# Patient Record
Sex: Female | Born: 1935 | Race: White | Hispanic: No | Smoking: Never smoker
Health system: Southern US, Community
[De-identification: ages and names within clinical notes are randomized; demographics above are authoritative.]

## PROBLEM LIST (undated history)

## (undated) DIAGNOSIS — H00022 Hordeolum internum right lower eyelid: Secondary | ICD-10-CM

## (undated) DIAGNOSIS — R739 Hyperglycemia, unspecified: Secondary | ICD-10-CM

## (undated) DIAGNOSIS — G309 Alzheimer's disease, unspecified: Secondary | ICD-10-CM

## (undated) DIAGNOSIS — N3281 Overactive bladder: Secondary | ICD-10-CM

## (undated) DIAGNOSIS — F419 Anxiety disorder, unspecified: Secondary | ICD-10-CM

## (undated) DIAGNOSIS — R609 Edema, unspecified: Secondary | ICD-10-CM

## (undated) DIAGNOSIS — M1990 Unspecified osteoarthritis, unspecified site: Secondary | ICD-10-CM

## (undated) DIAGNOSIS — I10 Essential (primary) hypertension: Secondary | ICD-10-CM

## (undated) DIAGNOSIS — E559 Vitamin D deficiency, unspecified: Secondary | ICD-10-CM

## (undated) DIAGNOSIS — K5900 Constipation, unspecified: Secondary | ICD-10-CM

## (undated) DIAGNOSIS — M810 Age-related osteoporosis without current pathological fracture: Secondary | ICD-10-CM

## (undated) DIAGNOSIS — Z66 Do not resuscitate: Secondary | ICD-10-CM

## (undated) DIAGNOSIS — R1312 Dysphagia, oropharyngeal phase: Secondary | ICD-10-CM

## (undated) DIAGNOSIS — L03119 Cellulitis of unspecified part of limb: Secondary | ICD-10-CM

## (undated) DIAGNOSIS — K5790 Diverticulosis of intestine, part unspecified, without perforation or abscess without bleeding: Secondary | ICD-10-CM

## (undated) DIAGNOSIS — R1310 Dysphagia, unspecified: Secondary | ICD-10-CM

## (undated) DIAGNOSIS — F329 Major depressive disorder, single episode, unspecified: Secondary | ICD-10-CM

## (undated) DIAGNOSIS — F0280 Dementia in other diseases classified elsewhere without behavioral disturbance: Secondary | ICD-10-CM

## (undated) DIAGNOSIS — F0390 Unspecified dementia without behavioral disturbance: Secondary | ICD-10-CM

## (undated) HISTORY — DX: Diverticulosis of intestine, part unspecified, without perforation or abscess without bleeding: K57.90

## (undated) HISTORY — DX: Unspecified osteoarthritis, unspecified site: M19.90

## (undated) HISTORY — DX: Alzheimer's disease, unspecified: G30.9

## (undated) HISTORY — PX: FOOT SURGERY: SHX648

## (undated) HISTORY — DX: Major depressive disorder, single episode, unspecified: F32.9

## (undated) HISTORY — DX: Dysphagia, oropharyngeal phase: R13.12

## (undated) HISTORY — DX: Edema, unspecified: R60.9

## (undated) HISTORY — DX: Cellulitis of unspecified part of limb: L03.119

## (undated) HISTORY — PX: OTHER SURGICAL HISTORY: SHX169

## (undated) HISTORY — DX: Hordeolum internum right lower eyelid: H00.022

## (undated) HISTORY — DX: Anxiety disorder, unspecified: F41.9

## (undated) HISTORY — DX: Dementia in other diseases classified elsewhere without behavioral disturbance: F02.80

## (undated) HISTORY — DX: Dysphagia, unspecified: R13.10

## (undated) HISTORY — DX: Constipation, unspecified: K59.00

## (undated) HISTORY — PX: FRACTURE SURGERY: SHX138

## (undated) HISTORY — PX: JOINT REPLACEMENT: SHX530

## (undated) HISTORY — PX: ADENOIDECTOMY: SUR15

## (undated) HISTORY — DX: Vitamin D deficiency, unspecified: E55.9

## (undated) HISTORY — DX: Hyperglycemia, unspecified: R73.9

## (undated) HISTORY — PX: TONSILLECTOMY: SUR1361

## (undated) HISTORY — DX: Age-related osteoporosis without current pathological fracture: M81.0

## (undated) HISTORY — PX: KNEE SURGERY: SHX244

---

## 2011-01-03 ENCOUNTER — Emergency Department (INDEPENDENT_AMBULATORY_CARE_PROVIDER_SITE_OTHER): Payer: Medicare Other

## 2011-01-03 ENCOUNTER — Emergency Department (HOSPITAL_BASED_OUTPATIENT_CLINIC_OR_DEPARTMENT_OTHER)
Admission: EM | Admit: 2011-01-03 | Discharge: 2011-01-03 | Disposition: A | Discharge status: Home / Self Care | Payer: Medicare Other | Attending: Emergency Medicine | Admitting: Emergency Medicine

## 2011-01-03 DIAGNOSIS — F0390 Unspecified dementia without behavioral disturbance: Secondary | ICD-10-CM | POA: Insufficient documentation

## 2011-01-03 DIAGNOSIS — Y9210 Unspecified residential institution as the place of occurrence of the external cause: Secondary | ICD-10-CM | POA: Insufficient documentation

## 2011-01-03 DIAGNOSIS — R079 Chest pain, unspecified: Secondary | ICD-10-CM

## 2011-01-03 DIAGNOSIS — S5000XA Contusion of unspecified elbow, initial encounter: Secondary | ICD-10-CM | POA: Insufficient documentation

## 2011-01-03 DIAGNOSIS — I10 Essential (primary) hypertension: Secondary | ICD-10-CM | POA: Insufficient documentation

## 2011-01-03 DIAGNOSIS — W1811XA Fall from or off toilet without subsequent striking against object, initial encounter: Secondary | ICD-10-CM

## 2011-01-03 DIAGNOSIS — Z79899 Other long term (current) drug therapy: Secondary | ICD-10-CM | POA: Insufficient documentation

## 2011-08-01 ENCOUNTER — Emergency Department (HOSPITAL_BASED_OUTPATIENT_CLINIC_OR_DEPARTMENT_OTHER)
Admission: EM | Admit: 2011-08-01 | Discharge: 2011-08-01 | Disposition: A | Discharge status: Skilled Nursing Facility | Payer: PRIVATE HEALTH INSURANCE | Attending: Emergency Medicine | Admitting: Emergency Medicine

## 2011-08-01 ENCOUNTER — Emergency Department (INDEPENDENT_AMBULATORY_CARE_PROVIDER_SITE_OTHER): Payer: PRIVATE HEALTH INSURANCE

## 2011-08-01 DIAGNOSIS — S7000XA Contusion of unspecified hip, initial encounter: Secondary | ICD-10-CM | POA: Insufficient documentation

## 2011-08-01 DIAGNOSIS — Y9210 Unspecified residential institution as the place of occurrence of the external cause: Secondary | ICD-10-CM | POA: Insufficient documentation

## 2011-08-01 DIAGNOSIS — W19XXXA Unspecified fall, initial encounter: Secondary | ICD-10-CM | POA: Insufficient documentation

## 2011-08-01 DIAGNOSIS — I10 Essential (primary) hypertension: Secondary | ICD-10-CM | POA: Insufficient documentation

## 2011-08-01 DIAGNOSIS — M79609 Pain in unspecified limb: Secondary | ICD-10-CM

## 2011-08-01 DIAGNOSIS — F0390 Unspecified dementia without behavioral disturbance: Secondary | ICD-10-CM | POA: Insufficient documentation

## 2011-08-01 DIAGNOSIS — Z79899 Other long term (current) drug therapy: Secondary | ICD-10-CM | POA: Insufficient documentation

## 2011-08-01 DIAGNOSIS — T148XXA Other injury of unspecified body region, initial encounter: Secondary | ICD-10-CM

## 2011-08-01 DIAGNOSIS — M25559 Pain in unspecified hip: Secondary | ICD-10-CM

## 2011-08-01 HISTORY — DX: Unspecified dementia, unspecified severity, without behavioral disturbance, psychotic disturbance, mood disturbance, and anxiety: F03.90

## 2011-08-01 HISTORY — DX: Overactive bladder: N32.81

## 2011-08-01 HISTORY — DX: Essential (primary) hypertension: I10

## 2011-08-01 NOTE — ED Notes (Signed)
Pt ambulatory to radiology

## 2011-08-01 NOTE — ED Provider Notes (Addendum)
History     CSN: [id number] Arrival date & time: 08/01/2011  7:24 PM   First MD Initiated Contact with Patient 08/01/11 1933      Chief Complaint  Patient presents with  . Hip Pain  . Fall    (Consider location/radiation/quality/duration/timing/severity/associated sxs/prior treatment) Patient is a 77 y.o. female presenting with hip pain and fall. The history is provided by a caregiver and a relative.  Hip Pain  Fall  level 5 caveat due to dementia  Patient at [facility] which is assisted living.  Caretaker came in at noon and noted patient had bruise and swelling left hip and bruise left upper arm.  Asked staff what happened and they reported she had pulled call bell and found on floor.  PMD Dr. Ward Chatters with [facility].   Past Medical History  Diagnosis Date  . Dementia   . Hypertension   . Overactive bladder     Past Surgical History  Procedure Date  . Fracture surgery   . Joint replacement     No family history on file.  History  Substance Use Topics  . Smoking status: Never Smoker   . Smokeless tobacco: Never Used  . Alcohol Use: No    OB History    Grav Para Term Preterm Abortions TAB SAB Ect Mult Living                  Review of Systems  Unable to perform ROS   Allergies  Review of patient's allergies indicates no known allergies.  Home Medications   Current Outpatient Rx  Name Route Sig Dispense Refill  . CALCIUM CARBONATE-VITAMIN D 500-200 MG-UNIT PO TABS Oral Take 1 tablet by mouth daily.      Marland Kitchen DOCUSATE SODIUM 100 MG PO CAPS Oral Take 100 mg by mouth 3 (three) times daily.      . DONEPEZIL HCL 23 MG PO TABS Oral Take 23 mg by mouth daily.      Marland Kitchen ESCITALOPRAM OXALATE 10 MG PO TABS Oral Take 10 mg by mouth daily.      Marland Kitchen LORATADINE 10 MG PO TABS Oral Take 10 mg by mouth daily.      Marland Kitchen LOSARTAN POTASSIUM 50 MG PO TABS Oral Take 50 mg by mouth daily.      . ABC PLUS SENIOR PO TABS Oral Take 1 tablet by mouth daily.      . OXYBUTYNIN  CHLORIDE ER 10 MG PO TB24 Oral Take 10 mg by mouth daily.      . ACETAMINOPHEN 325 MG PO TABS Oral Take 650 mg by mouth every 4 (four) hours as needed. For pain     . MEMANTINE HCL 5 (28)-10 (21) MG PO TABS Oral Take by mouth See admin instructions. 5 mg/day for =1 week; 5 mg twice daily for =1 week; 15 mg/day given in 5 mg and 10 mg separated doses for =1 week; then 10 mg twice daily       BP 143/74  Pulse 70  Resp 16  Ht 5\' 3"  (1.6 m)  Wt 135 lb (61.236 kg)  BMI 23.91 kg/m2  SpO2 97%  Physical Exam  Nursing note and vitals reviewed. Constitutional: She appears well-developed and well-nourished.  HENT:  Head: Normocephalic and atraumatic.  Eyes: Conjunctivae and EOM are normal. Pupils are equal, round, and reactive to light.  Neck: Normal range of motion. Neck supple.  Cardiovascular: Normal rate and regular rhythm.   Pulmonary/Chest: Effort normal and breath sounds normal.  Abdominal: Soft. Bowel sounds are normal.  Musculoskeletal:       Contusion left upper mid arm, contusion left lateral upper thigh    ED Course  Procedures (including critical care time)  Labs Reviewed - No data to display Dg Hip Complete Left  08/01/2011  *RADIOLOGY REPORT*  Clinical Data: 77 year old female with left hip pain following fall.  LEFT HIP - COMPLETE 2+ VIEW  Comparison: None  Findings: There is no evidence of acute fracture, subluxation or dislocation. Mild degenerative changes in both hips are noted. Moderate degenerative changes are present in the lower lumbar spine. Probable osteopenia is noted. No focal bony lesions are identified.  IMPRESSION: No evidence of acute bony abnormality.  Original Report Authenticated By: Rosendo Gros, M.D.   Dg Humerus Left  08/01/2011  *RADIOLOGY REPORT*  Clinical Data: 77 year old female with left arm pain following fall.  LEFT HUMERUS - 2+ VIEW  Comparison: 01/03/2011 chest radiograph  Findings: There is no evidence of acute fracture, subluxation or  dislocation. Severe degenerative changes of the left shoulder are present. A remote fracture of the distal humerus is noted. No suspicious focal bony lesions are noted.  IMPRESSION: No evidence of acute bony abnormality.  Severe degenerative changes of the left shoulder and remote distal humeral fracture.  Original Report Authenticated By: Rosendo Gros, M.D.     No diagnosis found.    MDM          Hilario Quarry, MD 08/01/11 0454  Hilario Quarry, MD 08/01/11 2001

## 2011-08-01 NOTE — ED Notes (Signed)
Family reports pt fell this am.  Pt has bruising and a "knot" on the left thigh.  Pt denies pain and ambulated to triage.

## 2011-08-01 NOTE — ED Notes (Signed)
Pt. Walked to room #7 from the triage / wt room area.  Steady gait noted by pt.

## 2011-12-06 ENCOUNTER — Emergency Department (HOSPITAL_BASED_OUTPATIENT_CLINIC_OR_DEPARTMENT_OTHER)
Admission: EM | Admit: 2011-12-06 | Discharge: 2011-12-07 | Disposition: A | Discharge status: Home / Self Care | Payer: PRIVATE HEALTH INSURANCE | Attending: Emergency Medicine | Admitting: Emergency Medicine

## 2011-12-06 ENCOUNTER — Emergency Department (INDEPENDENT_AMBULATORY_CARE_PROVIDER_SITE_OTHER): Payer: PRIVATE HEALTH INSURANCE

## 2011-12-06 ENCOUNTER — Encounter (HOSPITAL_BASED_OUTPATIENT_CLINIC_OR_DEPARTMENT_OTHER): Payer: Self-pay | Admitting: Student

## 2011-12-06 DIAGNOSIS — Y9210 Unspecified residential institution as the place of occurrence of the external cause: Secondary | ICD-10-CM | POA: Insufficient documentation

## 2011-12-06 DIAGNOSIS — S4980XA Other specified injuries of shoulder and upper arm, unspecified arm, initial encounter: Secondary | ICD-10-CM | POA: Insufficient documentation

## 2011-12-06 DIAGNOSIS — I10 Essential (primary) hypertension: Secondary | ICD-10-CM | POA: Insufficient documentation

## 2011-12-06 DIAGNOSIS — X58XXXA Exposure to other specified factors, initial encounter: Secondary | ICD-10-CM

## 2011-12-06 DIAGNOSIS — F0390 Unspecified dementia without behavioral disturbance: Secondary | ICD-10-CM | POA: Insufficient documentation

## 2011-12-06 DIAGNOSIS — S46909A Unspecified injury of unspecified muscle, fascia and tendon at shoulder and upper arm level, unspecified arm, initial encounter: Secondary | ICD-10-CM | POA: Insufficient documentation

## 2011-12-06 DIAGNOSIS — S4990XA Unspecified injury of shoulder and upper arm, unspecified arm, initial encounter: Secondary | ICD-10-CM

## 2011-12-06 DIAGNOSIS — W1809XA Striking against other object with subsequent fall, initial encounter: Secondary | ICD-10-CM | POA: Insufficient documentation

## 2011-12-06 DIAGNOSIS — M25519 Pain in unspecified shoulder: Secondary | ICD-10-CM

## 2011-12-06 MED ORDER — ACETAMINOPHEN 325 MG PO TABS
650.0000 mg | ORAL_TABLET | Freq: Once | ORAL | Status: AC
  Administered 2011-12-06: 650 mg via ORAL
  Filled 2011-12-06: qty 2

## 2011-12-06 NOTE — ED Provider Notes (Addendum)
History     CSN: [id number]  Arrival date & time 12/06/11  2123   First MD Initiated Contact with Patient 12/06/11 2250      Chief Complaint  Patient presents with  . Shoulder Injury    (Consider location/radiation/quality/duration/timing/severity/associated sxs/prior treatment) Patient is a 78 y.o. female presenting with shoulder injury. The history is provided by the nursing home and a caregiver.  Shoulder Injury This is a new problem. Episode onset: Unknown. The problem occurs constantly. The problem has not changed since onset.Pertinent negatives include no headaches. Exacerbated by: Palpation of the a.c. joint. The symptoms are relieved by nothing. She has tried nothing for the symptoms.   brought in by caregiver and son. Patient lives in assisted living and does have dementia and is not a reliable historian. She was at physical therapy today and her therapist noticed that she had a deformity in her left shoulder. She reportedly fell about 3 weeks ago injuring her left elbow and striking her head. She was evaluated at that time he did have x-rays are reported as normal. Since that time she is doing her normal day-to-day activities including using a walker and with assistance bathing and dressing herself. Patient denies any specific complaints. No known recent injuries otherwise. Patient was not complaining of any significant discomfort. Physical therapy and the therapist simply noticed it was described as her clavicle bone sticking up. Level V caveat applies.  Past Medical History  Diagnosis Date  . Dementia   . Hypertension   . Overactive bladder     Past Surgical History  Procedure Date  . Fracture surgery   . Joint replacement     History reviewed. No pertinent family history.  History  Substance Use Topics  . Smoking status: Never Smoker   . Smokeless tobacco: Never Used  . Alcohol Use: No    OB History    Grav Para Term Preterm Abortions TAB SAB Ect Mult Living                  Review of Systems  Neurological: Negative for headaches.   level V caveat applies otherwise  Allergies  Review of patient's allergies indicates no known allergies.  Home Medications   Current Outpatient Rx  Name Route Sig Dispense Refill  . CALCIUM CARBONATE-VITAMIN D 500-200 MG-UNIT PO TABS Oral Take 1 tablet by mouth daily.      Marland Kitchen CIPROFLOXACIN HCL 500 MG PO TABS Oral Take 500 mg by mouth 2 (two) times daily.    . DONEPEZIL HCL 10 MG PO TABS Oral Take 10 mg by mouth at bedtime as needed.    Marland Kitchen ESCITALOPRAM OXALATE 10 MG PO TABS Oral Take 10 mg by mouth daily.      Marland Kitchen LORAZEPAM 0.5 MG PO TABS Oral Take 0.5 mg by mouth every 8 (eight) hours.    Marland Kitchen LOSARTAN POTASSIUM 50 MG PO TABS Oral Take 50 mg by mouth daily.      Marland Kitchen MEMANTINE HCL 5 (28)-10 (21) MG PO TABS Oral Take by mouth See admin instructions. 5 mg/day for =1 week; 5 mg twice daily for =1 week; 15 mg/day given in 5 mg and 10 mg separated doses for =1 week; then 10 mg twice daily     . ABC PLUS SENIOR PO TABS Oral Take 1 tablet by mouth daily.      Marland Kitchen POLYETHYLENE GLYCOL 3350 PO PACK Oral Take 17 g by mouth daily.    . ACETAMINOPHEN 325 MG PO TABS  Oral Take 650 mg by mouth every 4 (four) hours as needed. For pain       BP 121/76  Pulse 76  Temp(Src) 98.1 F (36.7 C) (Oral)  Resp 20  Wt 130 lb (58.968 kg)  SpO2 96%  Physical Exam  Constitutional: She appears well-developed and well-nourished.  HENT:  Head: Normocephalic and atraumatic.       No clinical evidence of acute trauma  Eyes: Conjunctivae and EOM are normal. Pupils are equal, round, and reactive to light.  Neck: Trachea normal. Neck supple. No thyromegaly present.       No midline cervical tenderness or deformity.  Cardiovascular: Normal rate, regular rhythm, S1 normal, S2 normal and normal pulses.     No systolic murmur is present   No diastolic murmur is present  Pulses:      Radial pulses are 2+ on the right side, and 2+ on the left side.    Pulmonary/Chest: Effort normal and breath sounds normal. She has no wheezes. She has no rhonchi. She has no rales. She exhibits no tenderness.  Abdominal: Soft. Normal appearance and bowel sounds are normal. There is no tenderness. There is no CVA tenderness and negative Murphy's sign.  Musculoskeletal:       Left upper extremity: A.c. deformity at left shoulder consistent with clinical a.c. separation. Mild tenderness to palpation without any erythema or swelling. Good range of motion at shoulder, elbow and wrist. Distal neurovascular is intact without tenderness otherwise  Neurological: She is alert. She has normal strength. No cranial nerve deficit or sensory deficit. GCS eye subscore is 4. GCS verbal subscore is 5. GCS motor subscore is 6.       Alert and oriented to self only  Skin: Skin is warm and dry. No rash noted. She is not diaphoretic.  Psychiatric: Her speech is normal.       Cooperative and appropriate    ED Course  Procedures (including critical care time)  Labs Reviewed - No data to display Dg Shoulder Left  12/06/2011  *RADIOLOGY REPORT*  Clinical Data: Left shoulder injury, with left shoulder pain.  LEFT SHOULDER - 2+ VIEW  Comparison: Left humeral radiographs performed 08/01/2011  Findings: There is no evidence of fracture or dislocation.  There is diffuse degenerative change at the left humeral head, with extensive sclerotic change and joint space narrowing.  Large associated osteophytes are seen.  Lucency within the left humeral head appears to reflect the overlying glenoid.  The acromioclavicular joint is unremarkable in appearance.  No significant soft tissue abnormalities are seen.  Chronic peribronchial thickening and increased interstitial markings are noted at the visualized portions of the lungs.  IMPRESSION:  1.  No evidence of fracture or dislocation. 2.  Significant degenerative change at the left humeral head, with joint space loss and large associated osteophytes.  3.  Chronic changes at the visualized portions of the lungs.  Original Report Authenticated By: Tonia Ghent, M.D.    Gustavus Bryant. Tylenol. Sling provided.   MDM   Clinical left a.c. Separation ? Acuity due to dementia. I suspect this injury occurred 3 weeks ago when patient last fell. Plan sling, NSAIDs and orthopedic followup. Referral provided with acromioclavicular separation precautions        Sunnie Nielsen, MD 12/06/11 9563  Sunnie Nielsen, MD 12/06/11 [phone]

## 2011-12-06 NOTE — Discharge Instructions (Signed)
Acromioclavicular Injuries  The AC (acromioclavicular) joint is the joint in the shoulder where the collarbone (clavicle) meets the shoulder blade (scapula). The part of the shoulder blade connected to the collarbone is called the acromion. Common problems with and treatments for the [facility] joint are detailed below.  ARTHRITIS  Arthritis occurs when the joint has been injured and the smooth padding between the joints (cartilage) is lost. This is the wear and tear seen in most joints of the body if they have been overused. This causes the joint to produce pain and swelling which is worse with activity.  AC JOINT SEPARATION  AC joint separation means that the ligaments connecting the acromion of the shoulder blade and collarbone have been damaged, and the two bones no longer line up. AC separations can be anywhere from mild to severe, and are "graded" depending upon which ligaments are torn and how badly they are torn.  Grade I Injury: the least damage is done, and the [facility] joint still lines up.  Grade II Injury: damage to the ligaments which reinforce the [facility] joint. In a Grade II injury, these ligaments are stretched but not entirely torn. When stressed, the [facility] joint becomes painful and unstable.  Grade III Injury: AC and secondary ligaments are completely torn, and the collarbone is no longer attached to the shoulder blade. This results in deformity; a prominence of the end of the clavicle.  AC JOINT FRACTURE  AC joint fracture means that there has been a break in the bones of the [facility] joint, usually the end of the clavicle.  TREATMENT  TREATMENT OF AC ARTHRITIS  There is currently no way to replace the cartilage damaged by arthritis. The best way to improve the condition is to decrease the activities which aggravate the problem. Application of ice to the joint helps decrease pain and soreness (inflammation). The use of non-steroidal anti-inflammatory medication is helpful.  If less conservative measures do not  work, then cortisone shots (injections) may be used. These are anti-inflammatories; they decrease the soreness in the joint and swelling.  If non-surgical measures fail, surgery may be recommended. The procedure is generally removal of a portion of the end of the clavicle. This is the part of the collarbone closest to your acromion which is stabilized with ligaments to the acromion of the shoulder blade. This surgery may be performed using a tube-like instrument with a light (arthroscope) for looking into a joint. It may also be performed as an open surgery through a small incision by the surgeon. Most patients will have good range of motion within 6 weeks and may return to all activity including sports by 8-12 weeks, barring complications.  TREATMENT OF AN AC SEPARATION  The initial treatment is to decrease pain. This is best accomplished by immobilizing the arm in a sling and placing an ice pack to the shoulder for 20 to 30 minutes every 2 hours as needed. As the pain starts to subside, it is important to begin moving the fingers, wrist, elbow and eventually the shoulder in order to prevent a stiff or "frozen" shoulder. Instruction on when and how much to move the shoulder will be provided by your caregiver. The length of time needed to regain full motion and function depends on the amount or grade of the injury. Recovery from a Grade I AC separation usually takes 10 to 14 days, whereas a Grade III may take 6 to 8 weeks.  Grade I and II separations usually do not require surgery.  Even Grade III injuries usually allow return to full activity with few restrictions. Treatment is also based on the activity demands of the injured shoulder. For example, a high level quarterback with an injured throwing arm will receive more aggressive treatment than someone with a desk job who rarely uses his/her arm for strenuous activities. In some cases, a painful lump may persist which could require a later surgery. Surgery can  be very successful, but the benefits must be weighed against the potential risks.  TREATMENT OF AN AC JOINT FRACTURE  Fracture treatment depends on the type of fracture. Sometimes a splint or sling may be all that is required. Other times surgery may be required for repair. This is more frequently the case when the ligaments supporting the clavicle are completely torn. Your caregiver will help you with these decisions and together you can decide what will be the best treatment.  HOME CARE INSTRUCTIONS  Apply ice to the injury for 15 to 20 minutes each hour while awake for 2 days. Put the ice in a plastic bag and place a towel between the bag of ice and skin.  If a sling has been applied, wear it constantly for as long as directed by your caregiver, even at night. The sling or splint can be removed for bathing or showering or as directed. Be sure to keep the shoulder in the same place as when the sling is on. Do not lift the arm.  If a figure-of-eight splint has been applied it should be tightened gently by another person every day. Tighten it enough to keep the shoulders held back. Allow enough room to place the index finger between the body and strap. Loosen the splint immediately if there is numbness or tingling in the hands.  Take over-the-counter or prescription medicines for pain, discomfort or fever as directed by your caregiver.  If you or your child has received a follow up appointment, it is very important to keep that appointment in order to avoid long term complications, chronic pain or disability.  SEEK MEDICAL CARE IF:  The pain is not relieved with medications.  There is increased swelling or discoloration that continues to get worse rather than better.  You or your child has been unable to follow up as instructed.  There is progressive numbness and tingling in the arm, forearm or hand.  SEEK IMMEDIATE MEDICAL CARE IF:  The arm is numb, cold or pale.  There is increasing pain in the  hand, forearm or fingers.  MAKE SURE YOU:  Understand these instructions.  Will watch your condition.  Will get help right away if you are not doing well or get worse.

## 2011-12-06 NOTE — ED Notes (Signed)
Family refused offer of ice/sling application. States pt will be noncompliant and it will agitate her

## 2011-12-06 NOTE — ED Notes (Signed)
Family members noticed possible deformity to left shoulder today after pt was c/o of her bra strap hurting her shoulder. Pt has hx of left arm injuries, but the family states that her shoulder appearance is abnormal. Pt has limited movement. +PMS. No known injury.

## 2011-12-06 NOTE — ED Notes (Signed)
Pt in with c/o possible dislocated left shoulder, deformity noticed in triage.

## 2011-12-06 NOTE — ED Notes (Signed)
Warm blanket offered. Pt and family given drinks.

## 2012-04-10 ENCOUNTER — Emergency Department (HOSPITAL_BASED_OUTPATIENT_CLINIC_OR_DEPARTMENT_OTHER): Payer: PRIVATE HEALTH INSURANCE

## 2012-04-10 ENCOUNTER — Emergency Department (HOSPITAL_BASED_OUTPATIENT_CLINIC_OR_DEPARTMENT_OTHER)
Admission: EM | Admit: 2012-04-10 | Discharge: 2012-04-10 | Disposition: A | Discharge status: Home Health | Payer: PRIVATE HEALTH INSURANCE | Attending: Emergency Medicine | Admitting: Emergency Medicine

## 2012-04-10 ENCOUNTER — Encounter (HOSPITAL_BASED_OUTPATIENT_CLINIC_OR_DEPARTMENT_OTHER): Payer: Self-pay | Admitting: *Deleted

## 2012-04-10 DIAGNOSIS — IMO0002 Reserved for concepts with insufficient information to code with codable children: Secondary | ICD-10-CM

## 2012-04-10 DIAGNOSIS — M25522 Pain in left elbow: Secondary | ICD-10-CM

## 2012-04-10 DIAGNOSIS — N318 Other neuromuscular dysfunction of bladder: Secondary | ICD-10-CM | POA: Insufficient documentation

## 2012-04-10 DIAGNOSIS — F0390 Unspecified dementia without behavioral disturbance: Secondary | ICD-10-CM | POA: Insufficient documentation

## 2012-04-10 DIAGNOSIS — Z96698 Presence of other orthopedic joint implants: Secondary | ICD-10-CM | POA: Insufficient documentation

## 2012-04-10 DIAGNOSIS — I10 Essential (primary) hypertension: Secondary | ICD-10-CM | POA: Insufficient documentation

## 2012-04-10 DIAGNOSIS — W19XXXA Unspecified fall, initial encounter: Secondary | ICD-10-CM | POA: Insufficient documentation

## 2012-04-10 DIAGNOSIS — S51009A Unspecified open wound of unspecified elbow, initial encounter: Secondary | ICD-10-CM | POA: Insufficient documentation

## 2012-04-10 MED ORDER — TETANUS-DIPHTH-ACELL PERTUSSIS 5-2.5-18.5 LF-MCG/0.5 IM SUSP
INTRAMUSCULAR | Status: AC
  Filled 2012-04-10: qty 0.5

## 2012-04-10 MED ORDER — TETANUS-DIPHTH-ACELL PERTUSSIS 5-2.5-18.5 LF-MCG/0.5 IM SUSP
0.5000 mL | Freq: Once | INTRAMUSCULAR | Status: AC
  Administered 2012-04-10: 0.5 mL via INTRAMUSCULAR

## 2012-04-10 MED ORDER — TRAMADOL HCL 50 MG PO TABS: 50.0000 mg | ORAL_TABLET | Freq: Four times a day (QID) | ORAL | Status: AC | PRN

## 2012-04-10 NOTE — ED Notes (Signed)
Pt was an unwitnessed fall presents with left elbow and shoulder pain

## 2012-04-10 NOTE — ED Provider Notes (Signed)
History     CSN: [id number]  Arrival date & time      First MD Initiated Contact with Patient 04/10/12 0521      Chief Complaint  Patient presents with  . Fall    (Consider location/radiation/quality/duration/timing/severity/associated sxs/prior treatment) [facility] pt with unwitnessed fall roughly 30 min prior to presentation. She sustained L elbow injury. Pt states she does not remember falling and does not think she did. No head or neck injury. Pt denies CP, SOB, cough, abd pain, fever, chills, N/V/D. Unknown last Td Past Medical History  Diagnosis Date  . Dementia   . Hypertension   . Overactive bladder     Past Surgical History  Procedure Date  . Fracture surgery   . Joint replacement     History reviewed. No pertinent family history.  History  Substance Use Topics  . Smoking status: Never Smoker   . Smokeless tobacco: Never Used  . Alcohol Use: No    OB History    Grav Para Term Preterm Abortions TAB SAB Ect Mult Living                  Review of Systems  Allergies  Review of patient's allergies indicates no known allergies.  Home Medications   Current Outpatient Rx  Name Route Sig Dispense Refill  . ACETAMINOPHEN 325 MG PO TABS Oral Take 650 mg by mouth every 4 (four) hours as needed. For pain     . CALCIUM CARBONATE-VITAMIN D 500-200 MG-UNIT PO TABS Oral Take 1 tablet by mouth daily.      Marland Kitchen CIPROFLOXACIN HCL 500 MG PO TABS Oral Take 500 mg by mouth 2 (two) times daily.    . DONEPEZIL HCL 10 MG PO TABS Oral Take 10 mg by mouth at bedtime as needed.    Marland Kitchen ESCITALOPRAM OXALATE 10 MG PO TABS Oral Take 10 mg by mouth daily.      Marland Kitchen LORAZEPAM 0.5 MG PO TABS Oral Take 0.5 mg by mouth every 8 (eight) hours.    Marland Kitchen LOSARTAN POTASSIUM 50 MG PO TABS Oral Take 50 mg by mouth daily.      Marland Kitchen MEMANTINE HCL 5 (28)-10 (21) MG PO TABS Oral Take by mouth See admin instructions. 5 mg/day for =1 week; 5 mg twice daily for =1 week; 15 mg/day given in 5 mg and 10 mg separated  doses for =1 week; then 10 mg twice daily     . ABC PLUS SENIOR PO TABS Oral Take 1 tablet by mouth daily.      Marland Kitchen POLYETHYLENE GLYCOL 3350 PO PACK Oral Take 17 g by mouth daily.    . TRAMADOL HCL 50 MG PO TABS Oral Take 1 tablet (50 mg total) by mouth every 6 (six) hours as needed for pain. 15 tablet 0    BP 179/70  Pulse 67  Temp 97.6 F (36.4 C) (Oral)  Resp 16  SpO2 99%  Physical Exam  Nursing note and vitals reviewed. Constitutional: She is oriented to person, place, and time. She appears well-developed and well-nourished. No distress.  HENT:  Head: Normocephalic and atraumatic.  Mouth/Throat: Oropharynx is clear and moist.  Eyes: EOM are normal. Pupils are equal, round, and reactive to light.  Neck: Normal range of motion. Neck supple.       No posterior cervical midline TTP  Cardiovascular: Normal rate and regular rhythm.   Pulmonary/Chest: Effort normal and breath sounds normal. No respiratory distress. She has no wheezes. She has no  rales.  Abdominal: Soft. Bowel sounds are normal. There is no tenderness. There is no rebound and no guarding.  Musculoskeletal: She exhibits no edema and no tenderness.       Small skin tear to L elbow with scattered bruises. Pt is unable to extend elbow fully. She states she normally can not extend fully but this is decreased from baseline. No deformity, 2+ radial pulses  Neurological: She is alert and oriented to person, place, and time.       5/5 motor, sensation intact  Skin: Skin is warm and dry. No rash noted. No erythema.  Psychiatric: She has a normal mood and affect. Her behavior is normal.    ED Course  Procedures (including critical care time)  Labs Reviewed - No data to display Dg Elbow Complete Left  04/10/2012  *RADIOLOGY REPORT*  Clinical Data: Fall.  Elbow pain.  LEFT ELBOW - COMPLETE 3+ VIEW  Comparison: 01/03/2011  Findings: Deformity of the distal humerus and proximal radius noted from remote fracture.  Prominent medial  spurring of the medial epicondyle is present, with posterior angulation of the radial neck unchanged from prior exams.  Spurring of the coronoid process and medial articular margin of the distal humerus noted.  There is some expansion of the distal humeral metaphysis related to prior injury.  A small oblong metal foreign body is present posterolateral to the radial head.  Equivocal soft tissue swelling noted in the vicinity of the elbow. Exam is indeterminate for elbow effusion due to deformity.  IMPRESSION:  1.  Essentially stable appearance of the deformity in the distal humerus and proximal radius.  I do not see a definite acute fracture.  CT would offer greater negative predictive value given the degree of underlying deformity. 2.  Small metallic foreign body in the posterolateral soft tissues, stable.  Original Report Authenticated By: Dellia Cloud, M.D.     1. Skin tear   2. Elbow pain, left       MDM  Will update Td and r/o underlying injury.   Stable elbow. D/C back to NH.       Loren Racer, MD 04/10/12 [phone]

## 2012-06-02 ENCOUNTER — Emergency Department (HOSPITAL_BASED_OUTPATIENT_CLINIC_OR_DEPARTMENT_OTHER): Payer: PRIVATE HEALTH INSURANCE

## 2012-06-02 ENCOUNTER — Encounter (HOSPITAL_BASED_OUTPATIENT_CLINIC_OR_DEPARTMENT_OTHER): Payer: Self-pay | Admitting: *Deleted

## 2012-06-02 ENCOUNTER — Emergency Department (HOSPITAL_BASED_OUTPATIENT_CLINIC_OR_DEPARTMENT_OTHER)
Admission: EM | Admit: 2012-06-02 | Discharge: 2012-06-02 | Disposition: A | Discharge status: Home / Self Care | Payer: PRIVATE HEALTH INSURANCE | Attending: Emergency Medicine | Admitting: Emergency Medicine

## 2012-06-02 DIAGNOSIS — M25569 Pain in unspecified knee: Secondary | ICD-10-CM | POA: Insufficient documentation

## 2012-06-02 DIAGNOSIS — W050XXA Fall from non-moving wheelchair, initial encounter: Secondary | ICD-10-CM | POA: Insufficient documentation

## 2012-06-02 DIAGNOSIS — G319 Degenerative disease of nervous system, unspecified: Secondary | ICD-10-CM | POA: Insufficient documentation

## 2012-06-02 DIAGNOSIS — Z96659 Presence of unspecified artificial knee joint: Secondary | ICD-10-CM | POA: Insufficient documentation

## 2012-06-02 DIAGNOSIS — N318 Other neuromuscular dysfunction of bladder: Secondary | ICD-10-CM | POA: Insufficient documentation

## 2012-06-02 DIAGNOSIS — I10 Essential (primary) hypertension: Secondary | ICD-10-CM | POA: Insufficient documentation

## 2012-06-02 DIAGNOSIS — Y9210 Unspecified residential institution as the place of occurrence of the external cause: Secondary | ICD-10-CM | POA: Insufficient documentation

## 2012-06-02 DIAGNOSIS — N390 Urinary tract infection, site not specified: Secondary | ICD-10-CM | POA: Insufficient documentation

## 2012-06-02 DIAGNOSIS — F0390 Unspecified dementia without behavioral disturbance: Secondary | ICD-10-CM | POA: Insufficient documentation

## 2012-06-02 HISTORY — DX: Do not resuscitate: Z66

## 2012-06-02 LAB — CBC WITH DIFFERENTIAL/PLATELET
Eosinophils Relative: 2 % (ref 0–5)
HCT: 40 % (ref 36.0–46.0)
Hemoglobin: 14 g/dL (ref 12.0–15.0)
Lymphocytes Relative: 19 % (ref 12–46)
Lymphs Abs: 1.3 10*3/uL (ref 0.7–4.0)
MCV: 93.2 fL (ref 78.0–100.0)
Monocytes Relative: 13 % — ABNORMAL HIGH (ref 3–12)
Platelets: 195 10*3/uL (ref 150–400)
RBC: 4.29 MIL/uL (ref 3.87–5.11)
WBC: 6.6 10*3/uL (ref 4.0–10.5)

## 2012-06-02 LAB — COMPREHENSIVE METABOLIC PANEL
ALT: 7 U/L (ref 0–35)
Alkaline Phosphatase: 77 U/L (ref 39–117)
BUN: 10 mg/dL (ref 6–23)
CO2: 31 mEq/L (ref 19–32)
Calcium: 9.4 mg/dL (ref 8.4–10.5)
GFR calc Af Amer: >90 mL/min (ref >90)
GFR calc non Af Amer: 80 mL/min — ABNORMAL LOW (ref >90)
Glucose, Bld: 93 mg/dL (ref 70–99)
Sodium: 132 mEq/L — ABNORMAL LOW (ref 135–145)

## 2012-06-02 LAB — URINALYSIS, ROUTINE W REFLEX MICROSCOPIC
Bilirubin Urine: NEGATIVE
Glucose, UA: NEGATIVE mg/dL
Hgb urine dipstick: NEGATIVE
Specific Gravity, Urine: 1.009 (ref 1.005–1.030)
pH: 8 (ref 5.0–8.0)

## 2012-06-02 LAB — URINE MICROSCOPIC-ADD ON

## 2012-06-02 MED ORDER — CEPHALEXIN 250 MG PO CAPS: 250.0000 mg | ORAL_CAPSULE | Freq: Four times a day (QID) | ORAL | Status: AC

## 2012-06-02 MED ORDER — CEPHALEXIN 250 MG PO CAPS
250.0000 mg | ORAL_CAPSULE | Freq: Once | ORAL | Status: AC
  Administered 2012-06-02: 250 mg via ORAL
  Filled 2012-06-02 (×2): qty 1

## 2012-06-02 NOTE — Discharge Instructions (Signed)

## 2012-06-02 NOTE — ED Notes (Signed)
Per ems, patient feel out of wheelchair, no c/o pain, no visible injuries. At baseline, has fallen from chair previously

## 2012-06-02 NOTE — ED Provider Notes (Signed)
History     CSN: [id number]  Arrival date & time 06/02/12  4098   First MD Initiated Contact with Patient 06/02/12 [phone]     Level 5 caveat due to dementia Chief Complaint  Patient presents with  . Fall    (Consider location/radiation/quality/duration/timing/severity/associated sxs/prior treatment) HPI Patient from nh with report that she fell from wheelchair and has had several recent falls.  No other history known.  Patient unable to recall event or date and does not know why she is here.  She has no complaints.   Past Medical History  Diagnosis Date  . Dementia   . Hypertension   . Overactive bladder     Past Surgical History  Procedure Date  . Fracture surgery   . Joint replacement     No family history on file.  History  Substance Use Topics  . Smoking status: Never Smoker   . Smokeless tobacco: Never Used  . Alcohol Use: No    OB History    Grav Para Term Preterm Abortions TAB SAB Ect Mult Living                  Review of Systems  Unable to perform ROS   Allergies  Prednisone  Home Medications   Current Outpatient Rx  Name Route Sig Dispense Refill  . ACETAMINOPHEN 325 MG PO TABS Oral Take 650 mg by mouth every 4 (four) hours as needed. For pain     . CALCIUM CARBONATE-VITAMIN D 500-200 MG-UNIT PO TABS Oral Take 1 tablet by mouth daily.      . DONEPEZIL HCL 10 MG PO TABS Oral Take 10 mg by mouth at bedtime as needed.    Marland Kitchen ESCITALOPRAM OXALATE 10 MG PO TABS Oral Take 20 mg by mouth daily.     Marland Kitchen LOSARTAN POTASSIUM 50 MG PO TABS Oral Take 50 mg by mouth daily.      Marland Kitchen MEMANTINE HCL 10 MG PO TABS Oral Take 10 mg by mouth 2 (two) times daily.    . ABC PLUS SENIOR PO TABS Oral Take 1 tablet by mouth daily.      Marland Kitchen POLYETHYLENE GLYCOL 3350 PO PACK Oral Take 17 g by mouth daily.    . TRAMADOL HCL 50 MG PO TABS Oral Take 50 mg by mouth every 6 (six) hours as needed.      BP 158/72  Pulse 60  Temp 97.8 F (36.6 C) (Oral)  Resp 18  SpO2 98%  Physical  Exam  Nursing note and vitals reviewed. Constitutional: She appears well-developed and well-nourished.  HENT:  Head: Normocephalic and atraumatic.  Right Ear: External ear normal.  Left Ear: External ear normal.  Nose: Nose normal.  Mouth/Throat: Oropharynx is clear and moist.  Eyes: Conjunctivae normal and EOM are normal. Pupils are equal, round, and reactive to light.  Neck: Normal range of motion. Neck supple.  Cardiovascular: Normal rate and normal heart sounds.   Pulmonary/Chest: Effort normal and breath sounds normal.  Abdominal: Soft. Bowel sounds are normal.  Musculoskeletal:       Bilateral knee tenderness wit anterior scare c.w. Knee replacement.  Tenderness with palpation lateral aspect of left knee and medial aspect of right knee.  No external trauma visualized, no crepitus.  Full arom bilateral knees, hips, ankles.  Bilateral upper extremities full arom.  Spine nontender to palpation.   Neurological: She is alert.       Oriented to person not place or date.  Follows commands.  Skin: Skin is warm and dry.    ED Course  Procedures (including critical care time)  Labs Reviewed - No data to display No results found.   No diagnosis found.    MDM  Patient from nh with report of found on ground.  No obvious trauma noted and xray of knees (which were slightly tender) and head without acute findings.  Urine significant for probable uti and is cultured with keflex given.       Hilario Quarry, MD 06/02/12 1028

## 2012-06-04 LAB — URINE CULTURE: Colony Count: >=100000

## 2012-06-06 NOTE — ED Notes (Signed)
+   Urine Patient treated with Keflex-sensitive to same-chart appended per protocol MD. 

## 2012-06-15 ENCOUNTER — Emergency Department (HOSPITAL_BASED_OUTPATIENT_CLINIC_OR_DEPARTMENT_OTHER)
Admission: EM | Admit: 2012-06-15 | Discharge: 2012-06-15 | Disposition: A | Discharge status: Home / Self Care | Payer: PRIVATE HEALTH INSURANCE | Attending: Emergency Medicine | Admitting: Emergency Medicine

## 2012-06-15 ENCOUNTER — Emergency Department (HOSPITAL_BASED_OUTPATIENT_CLINIC_OR_DEPARTMENT_OTHER): Payer: PRIVATE HEALTH INSURANCE

## 2012-06-15 ENCOUNTER — Encounter (HOSPITAL_BASED_OUTPATIENT_CLINIC_OR_DEPARTMENT_OTHER): Payer: Self-pay | Admitting: Emergency Medicine

## 2012-06-15 DIAGNOSIS — Z79899 Other long term (current) drug therapy: Secondary | ICD-10-CM | POA: Insufficient documentation

## 2012-06-15 DIAGNOSIS — F0390 Unspecified dementia without behavioral disturbance: Secondary | ICD-10-CM | POA: Insufficient documentation

## 2012-06-15 DIAGNOSIS — I10 Essential (primary) hypertension: Secondary | ICD-10-CM | POA: Insufficient documentation

## 2012-06-15 DIAGNOSIS — S9030XA Contusion of unspecified foot, initial encounter: Secondary | ICD-10-CM | POA: Insufficient documentation

## 2012-06-15 DIAGNOSIS — S0083XA Contusion of other part of head, initial encounter: Secondary | ICD-10-CM

## 2012-06-15 DIAGNOSIS — S0990XA Unspecified injury of head, initial encounter: Secondary | ICD-10-CM | POA: Insufficient documentation

## 2012-06-15 DIAGNOSIS — S8000XA Contusion of unspecified knee, initial encounter: Secondary | ICD-10-CM | POA: Insufficient documentation

## 2012-06-15 DIAGNOSIS — W19XXXA Unspecified fall, initial encounter: Secondary | ICD-10-CM

## 2012-06-15 DIAGNOSIS — S0003XA Contusion of scalp, initial encounter: Secondary | ICD-10-CM | POA: Insufficient documentation

## 2012-06-15 DIAGNOSIS — S1093XA Contusion of unspecified part of neck, initial encounter: Secondary | ICD-10-CM | POA: Insufficient documentation

## 2012-06-15 DIAGNOSIS — Z66 Do not resuscitate: Secondary | ICD-10-CM | POA: Insufficient documentation

## 2012-06-15 DIAGNOSIS — W010XXA Fall on same level from slipping, tripping and stumbling without subsequent striking against object, initial encounter: Secondary | ICD-10-CM | POA: Insufficient documentation

## 2012-06-15 NOTE — ED Notes (Signed)
KED removed by Dr. Anitra Lauth and me. Patient given a warm a blanket, the bed is locked and in the lowest position. The call light is within reach. Care giver is with the patient at this time. She resting comfortably.

## 2012-06-15 NOTE — ED Notes (Signed)
Pt to ED via EMS from [facility] s/p fall; hematoma noted to forehead.

## 2012-06-15 NOTE — ED Provider Notes (Addendum)
History  This chart was scribed for Sandra Sprout, MD by Shari Heritage. The patient was seen in room MH12/MH12. Patient's care was started at 1620.     CSN: [id number]  Arrival date & time 06/15/12  1611   First MD Initiated Contact with Patient 06/15/12 1620      Chief Complaint  Patient presents with  . Fall  . Head Injury    Patient is a 78 y.o. female presenting with fall. The history is provided by the patient and a caregiver. No language interpreter was used.  Fall The accident occurred 1 to 2 hours ago. The fall occurred while walking. The point of impact was the head, left knee and right knee. The pain is present in the head. The patient is experiencing no pain. There was no drug use involved in the accident. There was no alcohol use involved in the accident. Pertinent negatives include no loss of consciousness. Treatment on scene includes a c-collar. She has tried nothing for the symptoms.    Sandra Holland is a 78 y.o. female who presents to the Emergency Department complaining of a wound to the forehead and bruising to the knees bilaterally resulting from a fall that occurred 1-2 hours ago. Patient states that she is not in significant pain. Patient fell and hit her head when she tried to walk around without a walker or cane at her nursing home. Patient is a [facility] at [facility] and was transported to the ED by EMS. Patient's caregiver states patient has some edema of the legs bilaterally at baseline. Patient's caregiver does not have a list of patient's current medications. Medical records indicate history of dementia, HTN and overactive bladder.  Past Medical History  Diagnosis Date  . Dementia   . Hypertension   . Overactive bladder   . DNR (do not resuscitate)     Past Surgical History  Procedure Date  . Fracture surgery   . Joint replacement     No family history on file.  History  Substance Use Topics  . Smoking status: Never Smoker   .  Smokeless tobacco: Never Used  . Alcohol Use: No    OB History    Grav Para Term Preterm Abortions TAB SAB Ect Mult Living                  Review of Systems  Constitutional: Negative.   HENT: Negative.   Eyes: Negative.   Respiratory: Negative.   Cardiovascular: Negative.   Gastrointestinal: Negative.   Genitourinary: Negative.   Musculoskeletal: Negative.   Skin: Positive for wound.  Neurological: Negative.  Negative for loss of consciousness and syncope.  Psychiatric/Behavioral: Negative.     Allergies  Hydrocodone and Prednisone  Home Medications   Current Outpatient Rx  Name Route Sig Dispense Refill  . ACETAMINOPHEN 325 MG PO TABS Oral Take 650 mg by mouth every 4 (four) hours as needed. For pain     . CALCIUM CARBONATE-VITAMIN D 500-200 MG-UNIT PO TABS Oral Take 1 tablet by mouth daily.      . DONEPEZIL HCL 10 MG PO TABS Oral Take 10 mg by mouth at bedtime as needed.    Marland Kitchen ESCITALOPRAM OXALATE 10 MG PO TABS Oral Take 20 mg by mouth daily.     Marland Kitchen LOSARTAN POTASSIUM 50 MG PO TABS Oral Take 50 mg by mouth daily.      Marland Kitchen MEMANTINE HCL 10 MG PO TABS Oral Take 10 mg by mouth 2 (two)  times daily.    . ABC PLUS SENIOR PO TABS Oral Take 1 tablet by mouth daily.      Marland Kitchen POLYETHYLENE GLYCOL 3350 PO PACK Oral Take 17 g by mouth daily.    . TRAMADOL HCL 50 MG PO TABS Oral Take 50 mg by mouth every 6 (six) hours as needed.      BP 183/88  Pulse 75  Temp 98 F (36.7 C)  Resp 19  SpO2 98%  Physical Exam  Constitutional: She is oriented to person, place, and time. She appears well-developed and well-nourished.  HENT:  Head: Normocephalic. Head is with abrasion.       Sizable hematoma over her forehead with a small abrasion.   Neck: Normal range of motion. Neck supple.  Cardiovascular: Normal rate and regular rhythm.   No murmur heard. Pulmonary/Chest: Effort normal and breath sounds normal. No respiratory distress.  Abdominal: Soft. Bowel sounds are normal.    Musculoskeletal:       Cervical back: She exhibits no tenderness and no bony tenderness.       Thoracic back: She exhibits no tenderness and no bony tenderness.       Lumbar back: She exhibits no tenderness and no bony tenderness.       Healing ecchymosis over the left foot.  No CTL spine tenderness or sign of injury.  Neurological: She is alert and oriented to person, place, and time.  Skin: Skin is warm and dry. Abrasion noted.  Psychiatric: She has a normal mood and affect. Her behavior is normal.    ED Course  Procedures (including critical care time) DIAGNOSTIC STUDIES: Oxygen Saturation is 98% on room air, normal by my interpretation.    COORDINATION OF CARE: 4:30pm- Patient informed of current plan for treatment and evaluation and agrees with plan at this time.   Labs Reviewed - No data to display  Ct Head Wo Contrast  06/15/2012  *RADIOLOGY REPORT*  Clinical Data:   Fall at nursing home.  Hematoma to forehead. Demented and hypertensive.  CT HEAD WITHOUT CONTRAST  Technique: Contiguous axial images were obtained from the base of the skull through the vertex without contrast  Comparison: Head CT of 06/02/2012.  Findings:  Bone windows demonstrate mild left frontal scalp soft tissue swelling.  Mild motion degradation inferiorly.  No underlying skull fracture.  Ethmoid air cell mucosal thickening. Clear mastoid air cells.  Cerumen within bilateral external ear canals.  Soft tissue windows demonstrate moderate to marked low density in the periventricular white matter likely related to small vessel disease.  Advanced cerebral atrophy. No  mass lesion, hemorrhage, hydrocephalus, acute infarct, intra-axial, or extra-axial fluid collection.  IMPRESSION:  1. Mild frontal scalp soft tissue swelling without acute intracranial abnormality. 2. Cerebral atrophy and small vessel ischemic change. 3.  Mild motion degradation inferiorly.  CT CERVICAL SPINE WITHOUT CONTRAST  Technique: Continous axial  images were obtained of the cervical spine without contrast.  Sagittal and coronal reformats were constructed.  Findings:  Spinal visualization through the mid T1 level. Prevertebral soft tissues are within normal limits.  Apparent soft tissue fullness at the right tongue base on image 34 of series 5 which could be due to underdistension. No apical pneumothorax. Multilevel spondylosis.  Skull base intact.  Maintenance of vertebral body height.  Trace C6- C7 anterolisthesis which is felt to be positional and degenerative. Near complete fusion of the right facet at C2-C3.  Coronal reformats demonstrate only marked degenerative changes at C1-C2.  IMPRESSION: Spondylosis,  without acute finding in the cervical spine.  Trace C6- C7 anterolisthesis is felt to be secondary to spondylosis.  Apparent soft tissue fullness at the right tongue base could be due to underdistension.  Consider physical exam correlation.   Original Report Authenticated By: Consuello Bossier, M.D.    Ct Cervical Spine Wo Contrast  06/15/2012  *RADIOLOGY REPORT*  Clinical Data:   Fall at nursing home.  Hematoma to forehead. Demented and hypertensive.  CT HEAD WITHOUT CONTRAST  Technique: Contiguous axial images were obtained from the base of the skull through the vertex without contrast  Comparison: Head CT of 06/02/2012.  Findings:  Bone windows demonstrate mild left frontal scalp soft tissue swelling.  Mild motion degradation inferiorly.  No underlying skull fracture.  Ethmoid air cell mucosal thickening. Clear mastoid air cells.  Cerumen within bilateral external ear canals.  Soft tissue windows demonstrate moderate to marked low density in the periventricular white matter likely related to small vessel disease.  Advanced cerebral atrophy. No  mass lesion, hemorrhage, hydrocephalus, acute infarct, intra-axial, or extra-axial fluid collection.  IMPRESSION:  1. Mild frontal scalp soft tissue swelling without acute intracranial abnormality. 2.  Cerebral atrophy and small vessel ischemic change. 3.  Mild motion degradation inferiorly.  CT CERVICAL SPINE WITHOUT CONTRAST  Technique: Continous axial images were obtained of the cervical spine without contrast.  Sagittal and coronal reformats were constructed.  Findings:  Spinal visualization through the mid T1 level. Prevertebral soft tissues are within normal limits.  Apparent soft tissue fullness at the right tongue base on image 34 of series 5 which could be due to underdistension. No apical pneumothorax. Multilevel spondylosis.  Skull base intact.  Maintenance of vertebral body height.  Trace C6- C7 anterolisthesis which is felt to be positional and degenerative. Near complete fusion of the right facet at C2-C3.  Coronal reformats demonstrate only marked degenerative changes at C1-C2.  IMPRESSION: Spondylosis, without acute finding in the cervical spine.  Trace C6- C7 anterolisthesis is felt to be secondary to spondylosis.  Apparent soft tissue fullness at the right tongue base could be due to underdistension.  Consider physical exam correlation.   Original Report Authenticated By: Consuello Bossier, M.D.      1. Fall   2. Traumatic hematoma of forehead       MDM   Patient with a mechanical fall today with evidence of injury to the floor head. She denies LOC and does not take anticoagulation. However the patient does have dementia and a caregiver is not sure that she can recall events accurately. She otherwise had no other signs of injury. Head and C-spine CT are negative. C-spine was cleared and there is no tenderness in her C-spine with full range of motion. At this time a small abrasion on her forehead did not require repair. Will discharge back to her facility.      I personally performed the services described in this documentation, which was scribed in my presence.  The recorded information has been reviewed and considered.    Sandra Sprout, MD 06/15/12 1725  Sandra Sprout, MD 06/15/12 1610

## 2013-02-04 DIAGNOSIS — R1311 Dysphagia, oral phase: Secondary | ICD-10-CM | POA: Diagnosis not present

## 2013-02-04 DIAGNOSIS — I129 Hypertensive chronic kidney disease with stage 1 through stage 4 chronic kidney disease, or unspecified chronic kidney disease: Secondary | ICD-10-CM | POA: Diagnosis not present

## 2013-02-04 DIAGNOSIS — R293 Abnormal posture: Secondary | ICD-10-CM | POA: Diagnosis not present

## 2013-02-04 DIAGNOSIS — M6281 Muscle weakness (generalized): Secondary | ICD-10-CM | POA: Diagnosis not present

## 2013-02-04 DIAGNOSIS — N182 Chronic kidney disease, stage 2 (mild): Secondary | ICD-10-CM | POA: Diagnosis not present

## 2013-02-04 DIAGNOSIS — Z993 Dependence on wheelchair: Secondary | ICD-10-CM | POA: Diagnosis not present

## 2013-02-04 DIAGNOSIS — R488 Other symbolic dysfunctions: Secondary | ICD-10-CM | POA: Diagnosis not present

## 2013-02-04 DIAGNOSIS — R1312 Dysphagia, oropharyngeal phase: Secondary | ICD-10-CM | POA: Diagnosis not present

## 2013-02-04 DIAGNOSIS — R262 Difficulty in walking, not elsewhere classified: Secondary | ICD-10-CM | POA: Diagnosis not present

## 2013-02-06 ENCOUNTER — Non-Acute Institutional Stay (SKILLED_NURSING_FACILITY): Payer: PRIVATE HEALTH INSURANCE | Admitting: Internal Medicine

## 2013-02-06 DIAGNOSIS — I10 Essential (primary) hypertension: Secondary | ICD-10-CM

## 2013-02-06 DIAGNOSIS — G309 Alzheimer's disease, unspecified: Secondary | ICD-10-CM

## 2013-02-06 DIAGNOSIS — K5900 Constipation, unspecified: Secondary | ICD-10-CM

## 2013-02-06 DIAGNOSIS — F0280 Dementia in other diseases classified elsewhere without behavioral disturbance: Secondary | ICD-10-CM

## 2013-02-06 DIAGNOSIS — F329 Major depressive disorder, single episode, unspecified: Secondary | ICD-10-CM

## 2013-02-10 ENCOUNTER — Non-Acute Institutional Stay (SKILLED_NURSING_FACILITY): Payer: PRIVATE HEALTH INSURANCE | Admitting: Internal Medicine

## 2013-02-10 DIAGNOSIS — I872 Venous insufficiency (chronic) (peripheral): Secondary | ICD-10-CM

## 2013-02-10 NOTE — Progress Notes (Signed)
Patient ID: Sandra Holland, female   DOB: 08/26/1924, 79 y.o.   MRN: [id number] [facility]  Chief complaint; question cellulitis.  History; this is a very pleasant 79 year old woman, who I believe came from an assisted living in [place], [place]. She was admitted to our service recently. She does have a history of cellulitis of the leg in fact there are notes with her that suggested she have an acute cellulitis on 12/19/2012. Doppler at that time was negative for DVT. He has a history of chronic edema of the legs, chronic erythema. Also, a history of venous insufficiency. Her granddaughter was in today and raised concerns of the staff about cellulitis of her legs and I been asked to see her because of this.  Physical exam. Extremities; she has had bilateral total knee replacement. Peripheral pulses in her dorsalis pedis are palpable bilaterally. She has erythema of both legs, chronic edema, clearly secondary to chronic venous stasis with chronic stasis dermatitis. There are no open wounds. No tenderness and no erythema.  Impression/plan Stasis dermatitis in the setting of chronic venous insufficiency. This patient has a history of cellulitis and remains at risk for this especially if her edema is not controlled, and/or she develops open ulcers. However, for now. There is no evidence of cellulitis and no need for antibiotics. Facility is using Coban, to control the swelling, which seems to be doing an adequate job for now

## 2013-02-25 ENCOUNTER — Non-Acute Institutional Stay (SKILLED_NURSING_FACILITY): Payer: PRIVATE HEALTH INSURANCE | Admitting: Internal Medicine

## 2013-02-25 DIAGNOSIS — F0280 Dementia in other diseases classified elsewhere without behavioral disturbance: Secondary | ICD-10-CM

## 2013-02-25 DIAGNOSIS — F329 Major depressive disorder, single episode, unspecified: Secondary | ICD-10-CM

## 2013-02-25 DIAGNOSIS — F32A Depression, unspecified: Secondary | ICD-10-CM

## 2013-02-25 DIAGNOSIS — G309 Alzheimer's disease, unspecified: Secondary | ICD-10-CM | POA: Insufficient documentation

## 2013-02-25 DIAGNOSIS — I10 Essential (primary) hypertension: Secondary | ICD-10-CM

## 2013-02-25 DIAGNOSIS — K5900 Constipation, unspecified: Secondary | ICD-10-CM | POA: Insufficient documentation

## 2013-02-25 HISTORY — DX: Depression, unspecified: F32.A

## 2013-02-25 HISTORY — DX: Dementia in other diseases classified elsewhere, unspecified severity, without behavioral disturbance, psychotic disturbance, mood disturbance, and anxiety: F02.80

## 2013-02-25 NOTE — Progress Notes (Signed)
PROGRESS NOTE  DATE: 02/25/2013  FACILITY: Nursing Home Location: [facility]  LEVEL OF CARE: SNF (31)  Routine Visit  CHIEF COMPLAINT:  Manage Alzheimer's dementia, hypertension and constipation  HISTORY OF PRESENT ILLNESS:  REASSESSMENT OF ONGOING PROBLEM(S):  DEMENTIA: The dementia remaines stable and continues to function adequately in the current living environment with supervision.  The patient has had little changes in behavior. No complications noted from the medications presently being used. Patient is a poor historian.  HTN: Pt 's HTN remains stable.  Staff Deny CP, sob, DOE, pedal edema, headaches, dizziness or visual disturbances.  No complications from the medications currently being used.  Last BP : 13472.  CONSTIPATION: The constipation remains stable. No complications from the medications presently being used.  Staff deny ongoing constipation, abdominal pain, nausea or vomiting.  PAST MEDICAL HISTORY : Reviewed.  No changes.  CURRENT MEDICATIONS: Reviewed per [facility]  REVIEW OF SYSTEMS: Unobtainable due to dementia  PHYSICAL EXAMINATION  VS:  T 97.6       P 85      RR 20      BP 134/72     POX %     WT (Lb) 148  GENERAL: no acute distress, normal body habitus EYES: conjunctivae normal, sclerae normal, normal eye lids NECK: supple, trachea midline, no neck masses, no thyroid tenderness, no thyromegaly LYMPHATICS: no LAN in the neck, no supraclavicular LAN RESPIRATORY: breathing is even & unlabored, BS CTAB CARDIAC: RRR, no murmur,no extra heart sounds, bilateral lower extremities wrapped GI: abdomen soft, normal BS, no masses, no tenderness, no hepatomegaly, no splenomegaly PSYCHIATRIC: the patient is alert & oriented to person, affect & behavior appropriate  LABS/RADIOLOGY:  5/14 CBC normal, BMP normal, TSH 0.9, hemoglobin A1c 5.4, vitamin D level 31 3/14 albumin 3.4, total protein 5.8 otherwise CMP  normal  ASSESSMENT/PLAN:  1. Alzheimer's dementia-advanced. 2. Hypertension-well controlled. 3. constipation-continue current laxatives. 4. depression-stable.  CPT CODE: 40981

## 2013-03-03 ENCOUNTER — Non-Acute Institutional Stay (SKILLED_NURSING_FACILITY): Payer: PRIVATE HEALTH INSURANCE | Admitting: Adult Health

## 2013-03-03 ENCOUNTER — Encounter: Payer: Self-pay | Admitting: Adult Health

## 2013-03-03 DIAGNOSIS — N390 Urinary tract infection, site not specified: Secondary | ICD-10-CM

## 2013-03-03 NOTE — Progress Notes (Signed)
Patient ID: Sandra Holland, female   DOB: 10-01-1923, 79 y.o.   MRN: [id number] Sandra Holland  Allergies  Allergen Reactions  . Hydrocodone   . Prednisone       Chief Complaint  Patient presents with  . Acute Visit    staff concerns     HPI: Staff reports that she is less responsive to her surroundings is having spastic movements present; her appetite is diminished today. She is normally more verbal and engaging in her environment and able to converse. She is unable to participate in the hpi or ros today except to tell me that she is cold and does not feel well.   Past Medical History  Diagnosis Date  . Dementia   . Hypertension   . Overactive bladder   . DNR (do not resuscitate)     Past Surgical History  Procedure Laterality Date  . Fracture surgery    . Joint replacement      VITAL SIGNS BP 124/58  Pulse 68  Ht 5\' 3"  (1.6 m)  Wt 148 lb (67.132 kg)  BMI 26.22 kg/m2   Patient's Medications  New Prescriptions   No medications on file  Previous Medications   ACETAMINOPHEN (TYLENOL) 325 MG TABLET    Take 650 mg by mouth every 6 (six) hours as needed. For pain   CALCIUM-VITAMIN D (OSCAL WITH D) 500-200 MG-UNIT PER TABLET    Take 1 tablet by mouth daily.     CHOLECALCIFEROL (VITAMIN D-3) 5000 UNITS TABS    Take 1 tablet by mouth once a week.   ESCITALOPRAM (LEXAPRO) 10 MG TABLET    Take 20 mg by mouth daily.    FOOD THICKENER (THICK IT) POWD    Take 1 Container by mouth as needed (for nectar thick).   FUROSEMIDE (LASIX) 20 MG TABLET    Take 30 mg by mouth daily.   LOSARTAN (COZAAR) 50 MG TABLET    Take 50 mg by mouth daily.     MULTIPLE VITAMINS-MINERALS (ABC PLUS SENIOR) TABS    Take 1 tablet by mouth daily.     POLYETHYLENE GLYCOL (MIRALAX / GLYCOLAX) PACKET    Take 17 g by mouth daily.   RIVASTIGMINE (EXELON) 4.6 MG/24HR    Place 1 patch onto the skin daily.  Modified Medications   No medications on file  Discontinued Medications   DONEPEZIL (ARICEPT) 10 MG  TABLET    Take 10 mg by mouth at bedtime as needed.   MEMANTINE (NAMENDA) 10 MG TABLET    Take 10 mg by mouth 2 (two) times daily.   TRAMADOL (ULTRAM) 50 MG TABLET    Take 50 mg by mouth every 6 (six) hours as needed.    SIGNIFICANT DIAGNOSTIC EXAMS   11-27-12:wbc 6.5; hgb 13.5; hct 39.0; mcv 96.5 plt 242;  glucose 76; bun 8; creat 0.65; k+ 4.2 Na++136; liver normal albumin 3.4  01-22-13: tsh 0.900; hgb a1c 5.4; vit d 31 02-13-13: wbc 8.0; hgb 12.5; hct 36.5; mcv 97.9; plt 226; glucose 77; bun 10; creat 0.61; k+ 4.2 Na++ 134     Review of Systems  Unable to perform ROS  Physical Exam  Constitutional: No distress.  Frail   Neck: Neck supple. No JVD present. No thyromegaly present.  Cardiovascular: Normal rate, regular rhythm and intact distal pulses.   Respiratory: Effort normal and breath sounds normal. No respiratory distress.  GI: Soft. Bowel sounds are normal. She exhibits no distension. There is no tenderness.  Musculoskeletal: She  exhibits edema.  1+ bilateral lower extremity edema; is able to move extremities; she is experiencing spastic movements in all extremities.   Neurological: She is alert.  Skin: Skin is warm and dry.      ASSESSMENT/ PLAN:  Uti: will get a ua/c&s; will have staff encourage fluid intake and will monitor her status

## 2013-03-04 NOTE — Progress Notes (Signed)
Patient ID: Sandra Holland, female   DOB: Sep 17, 1924, 79 y.o.   MRN: [id number]        HISTORY & PHYSICAL  DATE: 02/06/2013   FACILITY: [facility]  LEVEL OF CARE: SNF (31)  ALLERGIES:  Allergies  Allergen Reactions  . Hydrocodone   . Prednisone     CHIEF COMPLAINT:  Manage dementia, hypertension, and depression.    HISTORY OF PRESENT ILLNESS:  The patient is an 79 year-old, Caucasian female who is admitted from [facility] assisted living facility for possible long-term care management.  She has the following problems:    DEMENTIA: The dementia remains stable and continues to function adequately in the current living environment with supervision.  The patient has had little changes in behavior. No complications noted from the medications presently being used.  The patient is disoriented and overall a poor historian.    HTN: Pt 's HTN remains stable.  Staff denies CP, sob, DOE, pedal edema, headaches, dizziness or visual disturbances.  No complications from the medications currently being used.  Last BP :  130/74.  DEPRESSION: The depression remains stable. Staff denies ongoing feelings of sadness, insomnia, anedhonia or lack of appetite. No complications reported from the medications currently being used. Staff do not report behavioral problems.   PAST MEDICAL HISTORY :  Past Medical History  Diagnosis Date  . Dementia   . Hypertension   . Overactive bladder   . DNR (do not resuscitate)    Chronic kidney disease stage II.  History of closed right tibial spine fracture.  Degenerative joint disease..   Depression.    Diabetes mellitus.    Hyperlipidemia.    Impaired glucose tolerance.   L1 vertebral fracture.    Osteoporosis.    Allergic rhinitis.    Chronic venous insufficiency.    PAST SURGICAL HISTORY: Past Surgical History  Procedure Laterality Date  . Fracture surgery    . Joint replacement     Adenoidectomy.    Bilateral  cataract surgery.    Tonsillectomy.    Knee surgery.   Foot surgery.  SOCIAL HISTORY:  Per record:    reports that she has never smoked. She has never used smokeless tobacco. She reports that she does not drink alcohol or use illicit drugs. HOUSING:  She was a resident of [facility] assisted living facility.   MARITAL HISTORY:  She is a widow.   EDUCATION:  She is a high Garment/textile technologist as highest education.   EMPLOYMENT HISTORY:  Retired.    FAMILY HISTORY:  Per record:   FATHER:  Father died in his 35s from an acute MI.   MOTHER:  Mother died in her 17s from an acute MI.  She also had hypertension, diabetes mellitus, and coronary artery disease.    CURRENT MEDICATIONS: Reviewed per [facility]  REVIEW OF SYSTEMS:  Unobtainable due to dementia.    PHYSICAL EXAMINATION  VS:  T 96.5       P 78       RR 22       BP 130/74      POX%        WT (Lb)  GENERAL: no acute distress, normal body habitus SKIN: warm & dry, no suspicious lesions or rashes, no excessive dryness EYES: conjunctivae normal, sclerae normal, normal eye lids MOUTH/THROAT: lips without lesions,no lesions in the mouth,tongue is without lesions,uvula elevates in midline NECK: supple, trachea midline, no neck masses, no thyroid tenderness, no thyromegaly LYMPHATICS:  no LAN in the neck, no supraclavicular LAN RESPIRATORY: breathing is even & unlabored, BS CTAB CARDIAC: RRR, no murmur,no extra heart sounds, no edema ARTERIAL: pedal pulses nonpalpable  GI:  ABDOMEN: abdomen soft, normal BS, no masses, no tenderness  LIVER/SPLEEN: no hepatomegaly, no splenomegaly MUSCULOSKELETAL: HEAD: normal to inspection & palpation BACK: no kyphosis, scoliosis or spinal processes tenderness EXTREMITIES: LEFT UPPER EXTREMITY: strength intact, range of motion moderate  RIGHT UPPER EXTREMITY: strength intact, range of motion moderate  LEFT LOWER EXTREMITY:  full range of motion, normal strength & tone, wrapped RIGHT LOWER EXTREMITY:  full  range of motion, normal strength & tone, wrapped PSYCHIATRIC: the patient is alert, disoriented, affect & mood appropriate  LABS/RADIOLOGY: 11/2012:  Total protein 5.8, albumin 3.4, otherwise CMP normal.    CBC normal.    Hemoglobin A1C 5.4.    TSH 0.90.   Vitamin D level 31.  ASSESSMENT/PLAN:  Dementia.  Advanced.  Continue supportive care.   Hypertension.  Well controlled.    Depression.  Stable.    Constipation.  Continue MiraLAX.   I have reviewed patient's medical records received at admission/from hospitalization.  CPT CODE: 81191

## 2013-03-05 ENCOUNTER — Encounter: Payer: Self-pay | Admitting: Internal Medicine

## 2013-04-02 ENCOUNTER — Non-Acute Institutional Stay (SKILLED_NURSING_FACILITY): Payer: PRIVATE HEALTH INSURANCE | Admitting: Adult Health

## 2013-04-02 ENCOUNTER — Encounter: Payer: Self-pay | Admitting: Adult Health

## 2013-04-02 DIAGNOSIS — R1310 Dysphagia, unspecified: Secondary | ICD-10-CM

## 2013-04-02 DIAGNOSIS — G309 Alzheimer's disease, unspecified: Secondary | ICD-10-CM

## 2013-04-02 DIAGNOSIS — F0280 Dementia in other diseases classified elsewhere without behavioral disturbance: Secondary | ICD-10-CM

## 2013-04-02 DIAGNOSIS — R609 Edema, unspecified: Secondary | ICD-10-CM

## 2013-04-02 DIAGNOSIS — K5900 Constipation, unspecified: Secondary | ICD-10-CM

## 2013-04-02 DIAGNOSIS — F329 Major depressive disorder, single episode, unspecified: Secondary | ICD-10-CM

## 2013-04-02 DIAGNOSIS — I10 Essential (primary) hypertension: Secondary | ICD-10-CM

## 2013-04-02 HISTORY — DX: Edema, unspecified: R60.9

## 2013-04-02 HISTORY — DX: Dysphagia, unspecified: R13.10

## 2013-04-02 MED ORDER — SENNOSIDES-DOCUSATE SODIUM 8.6-50 MG PO TABS: 2.0000 | ORAL_TABLET | Freq: Every day | ORAL | Status: AC

## 2013-04-02 MED ORDER — RIVASTIGMINE 9.5 MG/24HR TD PT24: 1.0000 | MEDICATED_PATCH | Freq: Every day | TRANSDERMAL | Status: AC

## 2013-04-02 NOTE — Assessment & Plan Note (Signed)
No signs of aspiration present; will continue nectar thick liquids.  

## 2013-04-02 NOTE — Assessment & Plan Note (Signed)
Her status is unchanged will increase her exelon patch to 9.6 mg daily and will continue to monitor her status

## 2013-04-02 NOTE — Assessment & Plan Note (Signed)
Is stable will continue lasix 30 mg daily and will monitor her status

## 2013-04-02 NOTE — Assessment & Plan Note (Signed)
Will stop her miralax as she is on thickened liquids and will start senna s 2 tabs daily and will monitor

## 2013-04-02 NOTE — Assessment & Plan Note (Signed)
Is stable will continue cozaar 50 mg daily

## 2013-04-02 NOTE — Progress Notes (Signed)
Patient ID: Sandra Holland, female   DOB: [birth year], 79 y.o.   MRN: [id number]  [facility]  Allergies  Allergen Reactions  . Hydrocodone   . Prednisone      Chief Complaint  Patient presents with  . Medical Managment of Chronic Issues    HPI: She is being seen for the management of chronic  illnesses. She is doing well overall; there are no concerns being voiced by the nursing staff. She is unable to fully participate in the hpi ros. She has not had any further episodes of spastic movements.   Past Medical History  Diagnosis Date  . Dementia   . Hypertension   . Overactive bladder   . DNR (do not resuscitate)     Past Surgical History  Procedure Laterality Date  . Fracture surgery    . Joint replacement      VITAL SIGNS BP 142/77  Pulse 79  Ht 5\' 3"  (1.6 m)  Wt 148 lb 12.8 oz (67.495 kg)  BMI 26.37 kg/m2   Patient's Medications  New Prescriptions   No medications on file  Previous Medications   ACETAMINOPHEN (TYLENOL) 325 MG TABLET    Take 650 mg by mouth every 6 (six) hours as needed. For pain   CALCIUM-VITAMIN D (OSCAL WITH D) 500-200 MG-UNIT PER TABLET    Take 1 tablet by mouth daily.     CHOLECALCIFEROL (VITAMIN D-3) 5000 UNITS TABS    Take 1 tablet by mouth once a week.   ESCITALOPRAM (LEXAPRO) 10 MG TABLET    Take 20 mg by mouth daily.    FOOD THICKENER (THICK IT) POWD    Take 1 Container by mouth as needed (for nectar thick).   FUROSEMIDE (LASIX) 20 MG TABLET    Take 30 mg by mouth daily.   LOSARTAN (COZAAR) 50 MG TABLET    Take 50 mg by mouth daily.     MULTIPLE VITAMINS-MINERALS (ABC PLUS SENIOR) TABS    Take 1 tablet by mouth daily.     POLYETHYLENE GLYCOL (MIRALAX / GLYCOLAX) PACKET    Take 17 g by mouth daily.   RIVASTIGMINE (EXELON) 4.6 MG/24HR    Place 1 patch onto the skin daily.  Modified Medications   No medications on file  Discontinued Medications   No medications on file    SIGNIFICANT DIAGNOSTIC EXAMS   11-27-12:wbc 6.5; hgb  13.5; hct 39.0; mcv 96.5 plt 242;  glucose 76; bun 8; creat 0.65; k+ 4.2 Na++136; liver normal albumin 3.4  01-22-13: tsh 0.900; hgb a1c 5.4; vit d 31 02-13-13: wbc 8.0; hgb 12.5; hct 36.5; mcv 97.9; plt 226; glucose 77; bun 10; creat 0.61; k+ 4.2 Na++ 134  03-05-13: u/a: neg  Review of Systems  Unable to perform ROS  Physical Exam  Constitutional: She appears well-developed and well-nourished.  Neck: Neck supple. No thyromegaly present.  Cardiovascular: Normal rate, regular rhythm and intact distal pulses.   Respiratory: Effort normal and breath sounds normal. No respiratory distress.  GI: Soft. Bowel sounds are normal. She exhibits no distension. There is no tenderness.  Musculoskeletal:  Has trace edema bilaterally; is able to move extremities  Neurological: She is alert.  Skin: Skin is warm and dry.      ASSESSMENT/ PLAN:  Depressive disorder, not elsewhere classified Will continue her lexapro 20 mg daily and will continue to monitor her status   Unspecified constipation Will stop her miralax as she is on thickened liquids and will start senna s 2  tabs daily and will monitor  Essential hypertension, benign Is stable will continue cozaar 50 mg daily   Alzheimer's disease Her status is unchanged will increase her exelon patch to 9.6 mg daily and will continue to monitor her status   Edema Is stable will continue lasix 30 mg daily and will monitor her status   Dysphagia No signs of aspiration present will continue nectar thick liquids.

## 2013-04-02 NOTE — Assessment & Plan Note (Signed)
Will continue her lexapro 20 mg daily and will continue to monitor her status

## 2013-04-29 ENCOUNTER — Non-Acute Institutional Stay (SKILLED_NURSING_FACILITY): Payer: PRIVATE HEALTH INSURANCE | Admitting: Internal Medicine

## 2013-04-29 DIAGNOSIS — I10 Essential (primary) hypertension: Secondary | ICD-10-CM

## 2013-04-29 DIAGNOSIS — K5900 Constipation, unspecified: Secondary | ICD-10-CM

## 2013-04-29 DIAGNOSIS — N390 Urinary tract infection, site not specified: Secondary | ICD-10-CM

## 2013-04-29 DIAGNOSIS — F0280 Dementia in other diseases classified elsewhere without behavioral disturbance: Secondary | ICD-10-CM

## 2013-05-01 DIAGNOSIS — N390 Urinary tract infection, site not specified: Secondary | ICD-10-CM | POA: Insufficient documentation

## 2013-05-01 NOTE — Progress Notes (Signed)
PROGRESS NOTE  DATE: 8-12 - 14  FACILITY: Nursing Home Location: [facility]  LEVEL OF CARE: SNF (31)  Routine Visit  CHIEF COMPLAINT:  Manage UTI,  Alzheimer's dementia, hypertension and constipation  HISTORY OF PRESENT ILLNESS:  REASSESSMENT OF ONGOING PROBLEM(S):  UTI: 18/5 urine culture grew Escherichia coli greater than 100,000 colonies. Urinalysis showed pyuria.  DEMENTIA: The dementia remaines stable and continues to function adequately in the current living environment with supervision.  The patient has had little changes in behavior. No complications noted from the medications presently being used. Patient is a poor historian.  HTN: Pt 's HTN remains stable.  Staff Deny CP, sob, DOE, pedal edema, headaches, dizziness or visual disturbances.  No complications from the medications currently being used.  Last BP : 134/72, 113/72.  CONSTIPATION: The constipation remains stable. No complications from the medications presently being used.  Staff deny ongoing constipation, abdominal pain, nausea or vomiting.  PAST MEDICAL HISTORY : Reviewed.  No changes.  CURRENT MEDICATIONS: Reviewed per [facility]  REVIEW OF SYSTEMS: Unobtainable due to dementia  PHYSICAL EXAMINATION  VS:  T 98.4       P 82     RR 20      BP 113/72     POX %     WT (Lb) 149.2  GENERAL: no acute distress, normal body habitus EYES: conjunctivae normal, sclerae normal, normal eye lids NECK: supple, trachea midline, no neck masses, no thyroid tenderness, no thyromegaly LYMPHATICS: no LAN in the neck, no supraclavicular LAN RESPIRATORY: breathing is even & unlabored, BS CTAB CARDIAC: RRR, no murmur,no extra heart sounds, bilateral lower extremities wrapped GI: abdomen soft, normal BS, no masses, no tenderness, no hepatomegaly, no splenomegaly PSYCHIATRIC: the patient is alert & oriented to person, affect & behavior appropriate  LABS/RADIOLOGY:  5/14 CBC normal, BMP normal, TSH 0.9,  hemoglobin A1c 5.4, vitamin D level 31 3/14 albumin 3.4, total protein 5.8 otherwise CMP normal  ASSESSMENT/PLAN:  UTI: New problem. Macrobid 100 mg twice a day for 1 week started, florastar 1 tablet twice a day for 10 days started Alzheimer's dementia-advanced. Hypertension-well controlled. constipation-continue current laxatives. depression-stable.  CPT CODE: 09323

## 2013-05-20 ENCOUNTER — Non-Acute Institutional Stay (SKILLED_NURSING_FACILITY): Payer: PRIVATE HEALTH INSURANCE | Admitting: Internal Medicine

## 2013-05-20 DIAGNOSIS — K5900 Constipation, unspecified: Secondary | ICD-10-CM

## 2013-05-20 DIAGNOSIS — I10 Essential (primary) hypertension: Secondary | ICD-10-CM

## 2013-05-20 DIAGNOSIS — F329 Major depressive disorder, single episode, unspecified: Secondary | ICD-10-CM

## 2013-05-20 DIAGNOSIS — F0280 Dementia in other diseases classified elsewhere without behavioral disturbance: Secondary | ICD-10-CM

## 2013-05-21 NOTE — Progress Notes (Signed)
PROGRESS NOTE  DATE: 9-02 - 14  FACILITY: Nursing Home Location: [facility]  LEVEL OF CARE: SNF (31)  Routine Visit  CHIEF COMPLAINT:  Manage Alzheimer's dementia, hypertension and constipation  HISTORY OF PRESENT ILLNESS:  REASSESSMENT OF ONGOING PROBLEM(S):  DEMENTIA: The dementia remaines stable and continues to function adequately in the current living environment with supervision.  The patient has had little changes in behavior. No complications noted from the medications presently being used. Patient is a poor historian.  HTN: Pt 's HTN remains stable.  Staff Deny CP, sob, DOE, pedal edema, headaches, dizziness or visual disturbances.  No complications from the medications currently being used.  Last BP : 134/72, 113/72, 115/68.  CONSTIPATION: The constipation remains stable. No complications from the medications presently being used.  Staff deny ongoing constipation, abdominal pain, nausea or vomiting.  PAST MEDICAL HISTORY : Reviewed.  No changes.  CURRENT MEDICATIONS: Reviewed per [facility]  REVIEW OF SYSTEMS: Unobtainable due to dementia  PHYSICAL EXAMINATION  VS:  T 97.6       P 70     RR 18      BP 115/68     POX %     WT (Lb)  GENERAL: no acute distress, normal body habitus NECK: supple, trachea midline, no neck masses, no thyroid tenderness, no thyromegaly RESPIRATORY: breathing is even & unlabored, BS CTAB CARDIAC: RRR, no murmur,no extra heart sounds, bilateral lower extremities wrapped GI: abdomen soft, normal BS, no masses, no tenderness, no hepatomegaly, no splenomegaly PSYCHIATRIC: the patient is alert & oriented to person, affect & behavior appropriate  LABS/RADIOLOGY:  5/14 CBC normal, BMP normal, TSH 0.9, hemoglobin A1c 5.4, vitamin D level 31 3/14 albumin 3.4, total protein 5.8 otherwise CMP normal  ASSESSMENT/PLAN:  Alzheimer's dementia-advanced. Hypertension-well controlled. constipation-continue current  laxatives. depression-stable. Check liver profile  CPT CODE: 16109

## 2013-07-01 ENCOUNTER — Non-Acute Institutional Stay (SKILLED_NURSING_FACILITY): Payer: PRIVATE HEALTH INSURANCE | Admitting: Internal Medicine

## 2013-07-01 DIAGNOSIS — I10 Essential (primary) hypertension: Secondary | ICD-10-CM

## 2013-07-01 DIAGNOSIS — F329 Major depressive disorder, single episode, unspecified: Secondary | ICD-10-CM

## 2013-07-01 DIAGNOSIS — F3289 Other specified depressive episodes: Secondary | ICD-10-CM

## 2013-07-01 DIAGNOSIS — F0280 Dementia in other diseases classified elsewhere without behavioral disturbance: Secondary | ICD-10-CM

## 2013-07-01 DIAGNOSIS — K5900 Constipation, unspecified: Secondary | ICD-10-CM

## 2013-07-05 NOTE — Progress Notes (Signed)
PROGRESS NOTE  DATE: 10-14- 14  FACILITY: Nursing Home Location: [facility]  LEVEL OF CARE: SNF (31)  Routine Visit  CHIEF COMPLAINT:  Manage Alzheimer's dementia, hypertension and constipation  HISTORY OF PRESENT ILLNESS:  REASSESSMENT OF ONGOING PROBLEM(S):  DEMENTIA: The dementia remaines stable and continues to function adequately in the current living environment with supervision.  The patient has had little changes in behavior. No complications noted from the medications presently being used. Patient is a poor historian.  HTN: Pt 's HTN remains stable.  Staff Deny CP, sob, DOE, pedal edema, headaches, dizziness or visual disturbances.  No complications from the medications currently being used.  Last BP : 134/72, 113/72, 115/68, 146/64.  CONSTIPATION: The constipation remains stable. No complications from the medications presently being used.  Staff deny ongoing constipation, abdominal pain, nausea or vomiting.  PAST MEDICAL HISTORY : Reviewed.  No changes.  CURRENT MEDICATIONS: Reviewed per [facility]  REVIEW OF SYSTEMS: Unobtainable due to dementia  PHYSICAL EXAMINATION  VS:  T 98.5       P 59     RR 20      BP 146/64    POX %     WT (Lb) 142  GENERAL: no acute distress, normal body habitus NECK: supple, trachea midline, no neck masses, no thyroid tenderness, no thyromegaly RESPIRATORY: breathing is even & unlabored, BS CTAB CARDIAC: RRR, no murmur,no extra heart sounds, bilateral lower extremities wrapped GI: abdomen soft, normal BS, no masses, no tenderness, no hepatomegaly, no splenomegaly PSYCHIATRIC: the patient is alert & disoriented, affect & behavior appropriate  LABS/RADIOLOGY:  9-14 liver profile nl  5/14 CBC normal, BMP normal, TSH 0.9, hemoglobin A1c 5.4, vitamin D level 31 3/14 albumin 3.4, total protein 5.8 otherwise CMP normal  ASSESSMENT/PLAN:  Alzheimer's dementia-advanced. Hypertension-BP  borderline. constipation-continue current laxatives. depression-stable.  CPT CODE: 16109

## 2013-08-11 ENCOUNTER — Encounter: Payer: Self-pay | Admitting: Internal Medicine

## 2013-08-11 ENCOUNTER — Non-Acute Institutional Stay (SKILLED_NURSING_FACILITY): Payer: PRIVATE HEALTH INSURANCE | Admitting: Internal Medicine

## 2013-08-11 DIAGNOSIS — F329 Major depressive disorder, single episode, unspecified: Secondary | ICD-10-CM

## 2013-08-11 DIAGNOSIS — K5900 Constipation, unspecified: Secondary | ICD-10-CM

## 2013-08-11 DIAGNOSIS — I10 Essential (primary) hypertension: Secondary | ICD-10-CM

## 2013-08-11 DIAGNOSIS — F3289 Other specified depressive episodes: Secondary | ICD-10-CM

## 2013-08-11 DIAGNOSIS — F0280 Dementia in other diseases classified elsewhere without behavioral disturbance: Secondary | ICD-10-CM

## 2013-08-11 NOTE — Progress Notes (Signed)
PROGRESS NOTE  DATE: 11-24- 14  FACILITY: Nursing Home Location: [facility]  LEVEL OF CARE: SNF (31)  Routine Visit  CHIEF COMPLAINT:  Manage Alzheimer's dementia, hypertension and constipation  HISTORY OF PRESENT ILLNESS:  REASSESSMENT OF ONGOING PROBLEM(S):  DEMENTIA: The dementia remaines stable and continues to function adequately in the current living environment with supervision.  The patient has had little changes in behavior. No complications noted from the medications presently being used. Patient is a poor historian.  HTN: Pt 's HTN remains stable.  Staff Deny CP, sob, DOE, pedal edema, headaches, dizziness or visual disturbances.  No complications from the medications currently being used.  Last BP : 134/72, 113/72, 115/68, 146/64, 112/70.  CONSTIPATION: The constipation remains stable. No complications from the medications presently being used.  Staff deny ongoing constipation, abdominal pain, nausea or vomiting.  PAST MEDICAL HISTORY : Reviewed.  No changes.  CURRENT MEDICATIONS: Reviewed per [facility]  REVIEW OF SYSTEMS: Unobtainable due to dementia  PHYSICAL EXAMINATION  VS:  T 97.6       P 80     RR 16      BP 112/70    POX %     WT (Lb) 142.6  GENERAL: no acute distress, normal body habitus NECK: supple, trachea midline, no neck masses, no thyroid tenderness, no thyromegaly RESPIRATORY: breathing is even & unlabored, BS CTAB CARDIAC: RRR, no murmur,no extra heart sounds, bilateral lower extremities wrapped GI: abdomen soft, normal BS, no masses, no tenderness, no hepatomegaly, no splenomegaly PSYCHIATRIC: the patient is alert & disoriented, affect & behavior appropriate  LABS/RADIOLOGY:  10-14 glucose 111 otherwise BMP normal  9-14 liver profile nl  5/14 CBC normal, BMP normal, TSH 0.9, hemoglobin A1c 5.4, vitamin D level 31 3/14 albumin 3.4, total protein 5.8 otherwise CMP normal  ASSESSMENT/PLAN:  Alzheimer's  dementia-advanced. Hypertension-BP borderline. constipation-continue current laxatives. depression-stable. Check CBC  CPT CODE: 40981

## 2013-09-29 ENCOUNTER — Non-Acute Institutional Stay (SKILLED_NURSING_FACILITY): Payer: PRIVATE HEALTH INSURANCE | Admitting: Internal Medicine

## 2013-09-29 DIAGNOSIS — G309 Alzheimer's disease, unspecified: Principal | ICD-10-CM

## 2013-09-29 DIAGNOSIS — F3289 Other specified depressive episodes: Secondary | ICD-10-CM

## 2013-09-29 DIAGNOSIS — K5900 Constipation, unspecified: Secondary | ICD-10-CM

## 2013-09-29 DIAGNOSIS — F329 Major depressive disorder, single episode, unspecified: Secondary | ICD-10-CM

## 2013-09-29 DIAGNOSIS — F0280 Dementia in other diseases classified elsewhere without behavioral disturbance: Secondary | ICD-10-CM

## 2013-09-29 DIAGNOSIS — I10 Essential (primary) hypertension: Secondary | ICD-10-CM

## 2013-09-30 NOTE — Progress Notes (Signed)
         PROGRESS NOTE  DATE: 1-12- 15  FACILITY: Nursing Home Location: [facility]  LEVEL OF CARE: SNF (31)  Routine Visit  CHIEF COMPLAINT:  Manage Alzheimer's dementia, depression, hypertension and constipation  HISTORY OF PRESENT ILLNESS:  REASSESSMENT OF ONGOING PROBLEM(S):  DEMENTIA: The dementia remaines stable and continues to function adequately in the current living environment with supervision.  The patient has had little changes in behavior. No complications noted from the medications presently being used. Patient is a poor historian.  DEPRESSION: The depression remains stable. Staff deny ongoing feelings of sadness, insomnia, anedhonia or lack of appetite. No complications reported from the medications currently being used. Staff do not report behavioral problems. I was requested by the pharmacy consultant to assess the patient for possible dose reduction Lexapro.  HTN: Pt 's HTN remains stable.  Staff Deny CP, sob, DOE, pedal edema, headaches, dizziness or visual disturbances.  No complications from the medications currently being used.  Last BP : 134/72, 113/72, 115/68, 146/64, 112/70, 144/75.  CONSTIPATION: The constipation remains stable. No complications from the medications presently being used.  Staff deny ongoing constipation, abdominal pain, nausea or vomiting.  PAST MEDICAL HISTORY : Reviewed.  No changes.  [facility]  REVIEW OF SYSTEMS: Unobtainable due to dementia  PHYSICAL EXAMINATION  VS:  T 97       P 64     RR 18      BP 144/75    POX %     WT (Lb) 145  GENERAL: no acute distress, normal body habitus EYES: Normal sclerae, normal conjunctivae, no discharge NECK: supple, trachea midline, no neck masses, no thyroid tenderness, no thyromegaly LYMPHATICS: No cervical lymphadenopathy, no supraclavicular lymphadenopathy RESPIRATORY: breathing is even & unlabored, BS CTAB CARDIAC: RRR, no murmur,no extra  heart sounds, bilateral lower extremities wrapped GI: abdomen soft, normal BS, no masses, no tenderness, no hepatomegaly, no splenomegaly PSYCHIATRIC: the patient is alert & disoriented, affect & behavior appropriate  LABS/RADIOLOGY:  11-14 CBC normal  10-14 glucose 111 otherwise BMP normal  9-14 liver profile nl  5/14 CBC normal, BMP normal, TSH 0.9, hemoglobin A1c 5.4, vitamin D level 31 3/14 albumin 3.4, total protein 5.8 otherwise CMP normal  ASSESSMENT/PLAN:  Alzheimer's dementia-advanced. Hypertension-BP borderline. constipation-continue current laxatives. depression-stable. Decrease Lexapro to 10 mg daily   CPT CODE: [id number]

## 2013-10-20 ENCOUNTER — Non-Acute Institutional Stay (SKILLED_NURSING_FACILITY): Payer: PRIVATE HEALTH INSURANCE | Admitting: Internal Medicine

## 2013-10-20 DIAGNOSIS — R4182 Altered mental status, unspecified: Secondary | ICD-10-CM

## 2013-10-20 NOTE — Progress Notes (Signed)
         PROGRESS NOTE  DATE: 10/20/2013  [facility]  LEVEL OF CARE: SNF (31)  Acute Visit  CHIEF COMPLAINT:  Manage altered mental status  HISTORY OF PRESENT ILLNESS: I was requested by the staff to assess the patient regarding above problem(s):  Staff reports that patient is increasingly confused and restless since yesterday. Patient is a poor historian. Staff cannot identify precipitating or alleviating factors. Symptoms are constant. There are no other associated signs and symptoms.  PAST MEDICAL HISTORY : Reviewed.  No changes.  [facility]  REVIEW OF SYSTEMS: Unobtainable due to dementia  PHYSICAL EXAMINATION  VS:  T 98.2        P 77       RR 18       BP 120/74      GENERAL: no acute distress, normal body habitus EYES: conjunctivae normal, sclerae normal, normal eye lids NECK: supple, trachea midline, no neck masses, no thyroid tenderness, no thyromegaly LYMPHATICS: no LAN in the neck, no supraclavicular LAN RESPIRATORY: breathing is even & unlabored, BS CTAB CARDIAC: RRR, no murmur,no extra heart sounds, no extremities edematous and wrapped GI: abdomen soft, normal BS, no masses, no tenderness, no hepatomegaly, no splenomegaly PSYCHIATRIC: the patient is alert & disoriented, affect & behavior appropriate  ASSESSMENT/PLAN:  Altered mental status-new onset significant problem. Check CBC with differential, BMP, UA culture and sensitivities.  CPT CODE: 1610999309

## 2013-10-27 ENCOUNTER — Non-Acute Institutional Stay (SKILLED_NURSING_FACILITY): Payer: PRIVATE HEALTH INSURANCE | Admitting: Internal Medicine

## 2013-10-27 DIAGNOSIS — F0280 Dementia in other diseases classified elsewhere without behavioral disturbance: Secondary | ICD-10-CM

## 2013-10-27 DIAGNOSIS — I10 Essential (primary) hypertension: Secondary | ICD-10-CM

## 2013-10-27 DIAGNOSIS — F329 Major depressive disorder, single episode, unspecified: Secondary | ICD-10-CM

## 2013-10-27 DIAGNOSIS — F3289 Other specified depressive episodes: Secondary | ICD-10-CM

## 2013-10-27 DIAGNOSIS — K5900 Constipation, unspecified: Secondary | ICD-10-CM

## 2013-10-27 DIAGNOSIS — G309 Alzheimer's disease, unspecified: Principal | ICD-10-CM

## 2013-10-27 NOTE — Progress Notes (Signed)
          PROGRESS NOTE  DATE: 2-9- 15  FACILITY: Nursing Home Location: [facility]  LEVEL OF CARE: SNF (31)  Routine Visit  CHIEF COMPLAINT:  Manage Alzheimer's dementia, depression, hypertension and constipation  HISTORY OF PRESENT ILLNESS:  REASSESSMENT OF ONGOING PROBLEM(S):  DEMENTIA: The dementia remaines stable and continues to function adequately in the current living environment with supervision.  The patient has had little changes in behavior. No complications noted from the medications presently being used. Patient is a poor historian.  DEPRESSION: The depression remains stable. Staff deny ongoing feelings of sadness, insomnia, anedhonia or lack of appetite. No complications reported from the medications currently being used. Staff do not report behavioral problems. I was requested by the pharmacy consultant to assess the patient for possible dose reduction Lexapro.  HTN: Pt 's HTN remains stable.  Staff Deny CP, sob, DOE, pedal edema, headaches, dizziness or visual disturbances.  No complications from the medications currently being used.  Last BP : 134/72, 113/72, 115/68, 146/64, 112/70, 144/75, 140/70.  CONSTIPATION: The constipation remains stable. No complications from the medications presently being used.  Staff deny ongoing constipation, abdominal pain, nausea or vomiting.  PAST MEDICAL HISTORY : Reviewed.  No changes.  [facility]  REVIEW OF SYSTEMS: Unobtainable due to dementia  PHYSICAL EXAMINATION  VS:  T 98.4       P 65     RR 18      BP 140/70    POX %95     WT (Lb) 143.4  GENERAL: no acute distress, normal body habitus NECK: supple, trachea midline, no neck masses, no thyroid tenderness, no thyromegaly RESPIRATORY: breathing is even & unlabored, BS CTAB CARDIAC: RRR, no murmur,no extra heart sounds, bilateral lower extremities wrapped GI: abdomen soft, normal BS, no masses, no tenderness, no  hepatomegaly, no splenomegaly PSYCHIATRIC: the patient is alert & disoriented, affect & behavior appropriate  LABS/RADIOLOGY: 2-15 MCV 11.1 otherwise CBC normal, BMP normal  11-14 CBC normal  10-14 glucose 111 otherwise BMP normal  9-14 liver profile nl  5/14 CBC normal, BMP normal, TSH 0.9, hemoglobin A1c 5.4, vitamin D level 31 3/14 albumin 3.4, total protein 5.8 otherwise CMP normal  ASSESSMENT/PLAN:  Alzheimer's dementia-advanced. Hypertension-BP borderline. constipation-continue current laxatives. depression-stable.   CPT CODE: [id number]

## 2013-12-05 ENCOUNTER — Non-Acute Institutional Stay (SKILLED_NURSING_FACILITY): Payer: PRIVATE HEALTH INSURANCE | Admitting: Internal Medicine

## 2013-12-05 DIAGNOSIS — G309 Alzheimer's disease, unspecified: Secondary | ICD-10-CM

## 2013-12-05 DIAGNOSIS — R609 Edema, unspecified: Secondary | ICD-10-CM

## 2013-12-05 DIAGNOSIS — I872 Venous insufficiency (chronic) (peripheral): Secondary | ICD-10-CM

## 2013-12-05 DIAGNOSIS — F0280 Dementia in other diseases classified elsewhere without behavioral disturbance: Secondary | ICD-10-CM

## 2013-12-06 NOTE — Progress Notes (Signed)
Patient ID: Sandra Holland, female   DOB: 15-May-1924, 80 y.o.   MRN: [id number]  Location:  [facility] Provider:  Gwenith Spitz. Renato Gails, D.O., C.M.D.  Chief Complaint  Patient presents with  . Acute Visit    a lot of fluid in both feet noted 12/01/13    HPI:  80 yo white female with h/o Alzheimer's disease, hypertension, overactive bladder, depression, constipation and dysphagia.  It appears she also has some chronic venous stasis changes.  Her legs are wrapped with unnaboots.  One of her CNAs notes that her feet themselves have been more swolllen in the past week.  She has not been weighed since the beginning of the month when she weighed 145 lbs.  She is on lasix 30mg  daily at this time.  She says she is more short of breath, but none of the staff have noted this change including her sitter.    Review of Systems:  Review of Systems  Constitutional: Positive for malaise/fatigue. Negative for fever, chills and weight loss.  Respiratory: Positive for shortness of breath. Negative for cough and wheezing.   Cardiovascular: Positive for leg swelling. Negative for chest pain.  Gastrointestinal: Negative for abdominal pain.  Genitourinary: Negative for dysuria.  Musculoskeletal: Negative for falls.  Skin: Negative for rash.  Neurological: Positive for weakness. Negative for dizziness.  Endo/Heme/Allergies: Bruises/bleeds easily.  Psychiatric/Behavioral: Positive for memory loss.    Medications: Patient's Medications  New Prescriptions   No medications on file  Previous Medications   ACETAMINOPHEN (TYLENOL) 325 MG TABLET    Take 650 mg by mouth every 6 (six) hours as needed. For pain   CALCIUM-VITAMIN D (OSCAL WITH D) 500-200 MG-UNIT PER TABLET    Take 1 tablet by mouth daily.     CHOLECALCIFEROL (VITAMIN D-3) 5000 UNITS TABS    Take 1 tablet by mouth once a week.   ESCITALOPRAM (LEXAPRO) 10 MG TABLET    Take 20 mg by mouth daily.    FOOD THICKENER (THICK IT) POWD    Take 1  Container by mouth as needed (for nectar thick).   FUROSEMIDE (LASIX) 20 MG TABLET    Take 30 mg by mouth daily.   LOSARTAN (COZAAR) 50 MG TABLET    Take 50 mg by mouth daily.     MULTIPLE VITAMINS-MINERALS (ABC PLUS SENIOR) TABS    Take 1 tablet by mouth daily.     RIVASTIGMINE (EXELON) 9.5 MG/24HR    Place 1 patch (9.5 mg total) onto the skin daily.   SENNA-DOCUSATE (SENOKOT-S) 8.6-50 MG PER TABLET    Take 2 tablets by mouth daily.  Modified Medications   No medications on file  Discontinued Medications   No medications on file    Physical Exam: Filed Vitals:   12/05/13 0817  BP: 119/68  Pulse: 73  Temp: 98.8 F (37.1 C)  Resp: 20  Weight: 145 lb (65.772 kg)   Physical Exam  Constitutional: No distress.  HENT:  Head: Normocephalic and atraumatic.  Cardiovascular: Normal rate, regular rhythm and normal heart sounds.   Pulmonary/Chest: Effort normal and breath sounds normal. She has no rales.  Abdominal: Soft. Bowel sounds are normal. She exhibits no distension. There is no tenderness.  Musculoskeletal: Normal range of motion.  unnaboots in place bilateral lower legs, 2+ edema of feet bilaterally  Neurological: She is alert.  Psychiatric: She has a normal mood and affect.    Assessment/Plan 1. Chronic venous insufficiency -continues with unnaboot wraps -her aide  noted increased edema in her feet -weights are only monthly so unclear if elevated -will give extra lasix 20mg  for 2 days and kcl 20meq once on Sunday -check bmp monday  2. Edema -likely related to food choices past few days  3. Alzheimer's disease -here for long term care -continues on exelon patch alone -has sitter with her -is pleasantly confused  Family/ staff Communication: discussed with CNA, sitter and nursing  Labs/tests ordered:  BMP next draw

## 2013-12-08 ENCOUNTER — Encounter: Payer: Self-pay | Admitting: Internal Medicine

## 2013-12-08 DIAGNOSIS — I872 Venous insufficiency (chronic) (peripheral): Secondary | ICD-10-CM | POA: Insufficient documentation

## 2014-01-08 ENCOUNTER — Non-Acute Institutional Stay (SKILLED_NURSING_FACILITY): Payer: PRIVATE HEALTH INSURANCE | Admitting: Internal Medicine

## 2014-01-08 DIAGNOSIS — F329 Major depressive disorder, single episode, unspecified: Secondary | ICD-10-CM

## 2014-01-08 DIAGNOSIS — I10 Essential (primary) hypertension: Secondary | ICD-10-CM

## 2014-01-08 DIAGNOSIS — G309 Alzheimer's disease, unspecified: Principal | ICD-10-CM

## 2014-01-08 DIAGNOSIS — K5900 Constipation, unspecified: Secondary | ICD-10-CM

## 2014-01-08 DIAGNOSIS — F0280 Dementia in other diseases classified elsewhere without behavioral disturbance: Secondary | ICD-10-CM

## 2014-01-08 DIAGNOSIS — F3289 Other specified depressive episodes: Secondary | ICD-10-CM

## 2014-01-09 NOTE — Progress Notes (Signed)
           PROGRESS NOTE  DATE: 4-23- 15  FACILITY: Nursing Home Location: [facility]  LEVEL OF CARE: SNF (31)  Routine Visit  CHIEF COMPLAINT:  Manage Alzheimer's dementia, depression, hypertension and constipation  HISTORY OF PRESENT ILLNESS:  REASSESSMENT OF ONGOING PROBLEM(S):  DEMENTIA: The dementia remaines stable and continues to function adequately in the current living environment with supervision.  The patient has had little changes in behavior. No complications noted from the medications presently being used. Patient is a poor historian. I was requested by the pharmacy consultant to assess the patient for possible dose reduction Lexapro.  DEPRESSION: The depression remains stable. Staff deny ongoing feelings of sadness, insomnia, anedhonia or lack of appetite. No complications reported from the medications currently being used. Staff do not report behavioral problems. I was requested by the pharmacy consultant to assess the patient for possible dose reduction Lexapro.  HTN: Pt 's HTN remains stable.  Staff Deny CP, sob, DOE, pedal edema, headaches, dizziness or visual disturbances.  No complications from the medications currently being used.  Last BP : 134/72, 113/72, 115/68, 146/64, 112/70, 144/75, 140/70, 134/83.  CONSTIPATION: The constipation remains stable. No complications from the medications presently being used.  Staff deny ongoing constipation, abdominal pain, nausea or vomiting.  PAST MEDICAL HISTORY : Reviewed.  No changes.  [facility]  REVIEW OF SYSTEMS: Unobtainable due to dementia  PHYSICAL EXAMINATION  VS:  See vital signs section  GENERAL: no acute distress, normal body habitus EYES: Normal sclerae, normal conjunctivae, no discharge NECK: supple, trachea midline, no neck masses, no thyroid tenderness, no thyromegaly LYMPHATICS: No cervical lymphadenopathy, no supraclavicular  lymphadenopathy RESPIRATORY: breathing is even & unlabored, BS CTAB CARDIAC: RRR, no murmur,no extra heart sounds, bilateral lower extremities wrapped GI: abdomen soft, normal BS, no masses, no tenderness, no hepatomegaly, no splenomegaly PSYCHIATRIC: the patient is alert & disoriented, affect & behavior appropriate  LABS/RADIOLOGY: 4-15 glucose 124 otherwise BMP normal 2-15 MCV 101.1 otherwise CBC normal, BMP normal  11-14 CBC normal  10-14 glucose 111 otherwise BMP normal  9-14 liver profile nl  5/14 CBC normal, BMP normal, TSH 0.9, hemoglobin A1c 5.4, vitamin D level 31 3/14 albumin 3.4, total protein 5.8 otherwise CMP normal  ASSESSMENT/PLAN:  Alzheimer's dementia-advanced. Hypertension-stable constipation-continue current laxatives. depression-stable. She Lexapro at current dose because it is beneficial to the patient per discussion with staff. Hyperglycemia-new problem. Check hemoglobin A1c and fasting glucose level. Check liver profile  CPT CODE: [id number]  Newton PiggGayani Y. Kerry Doryasanayaka, MD [facility] [phone]

## 2014-02-18 ENCOUNTER — Encounter: Payer: Self-pay | Admitting: Internal Medicine

## 2014-02-18 ENCOUNTER — Non-Acute Institutional Stay (SKILLED_NURSING_FACILITY): Payer: Medicare HMO | Admitting: Internal Medicine

## 2014-02-18 DIAGNOSIS — R609 Edema, unspecified: Secondary | ICD-10-CM

## 2014-02-18 DIAGNOSIS — F3289 Other specified depressive episodes: Secondary | ICD-10-CM

## 2014-02-18 DIAGNOSIS — F329 Major depressive disorder, single episode, unspecified: Secondary | ICD-10-CM

## 2014-02-18 DIAGNOSIS — F0280 Dementia in other diseases classified elsewhere without behavioral disturbance: Secondary | ICD-10-CM

## 2014-02-18 DIAGNOSIS — N390 Urinary tract infection, site not specified: Secondary | ICD-10-CM

## 2014-02-18 DIAGNOSIS — G309 Alzheimer's disease, unspecified: Secondary | ICD-10-CM

## 2014-02-18 DIAGNOSIS — I10 Essential (primary) hypertension: Secondary | ICD-10-CM

## 2014-02-18 DIAGNOSIS — I872 Venous insufficiency (chronic) (peripheral): Secondary | ICD-10-CM

## 2014-02-18 DIAGNOSIS — K5900 Constipation, unspecified: Secondary | ICD-10-CM

## 2014-02-18 NOTE — Progress Notes (Signed)
Patient ID: Sandra Holland, female   DOB: 1924-04-14, 80 y.o.   MRN: [id number]  this is a routine visit.  Level of care skilled.  Facility AF.  Complaint-medical management of dementia hypertension depression constipation.  History of present illness.  Patient is a pleasant 80 year old female with the above diagnoses-she appears to be stable with no complaints of fever chills or discomfort she is sitting comfortably in her wheelchair today she has a sitter with her-she is completing a course of amoxicillin for an Escherichia coli UTI is not complaining of any back pain or dysuria today apparently she had had some increased confusion and agitation-- according to her  sitter she is still having some.  Her vital signs are stable she is afebrile.  She does have some history of lower extremity edema it appears as on low dose Lasix apparently this has been fairly stable as well.  Family medical social history has been reviewed per previous progress note including 12/05/2013--01/08/2014.  Medications have been reviewed per MAR.  Review of systems Limited secondary to patient being a poor historian with dementia.  In general no fever chills noted.  Respiratory no increased shortness of breath or cough. As been noted by staff.  Cardiac she does not complaining of chest pain--appears to have some chronic venous stasis changes per chart review  GI no complaints of nausea vomiting or abdominal pain--she does have a history of constipation and apparently this has been stable currently on senna.  GU being treated for UTI but is not specifically claiming of dysuria today.  Neurologic she is not complaining of any dizziness or headache  Psych she does have a history of dementia but is quite pleasant and appropriate during exam.  Physical exam.  Temperature is 97.8 pulse 76 respirations 18 blood pressure 110/65-136/80 most recently.  In general this is a pleasant elderly female in no  distress sitting comfortably in her wheelchair.  Her skin is warm and dry.  She does have a dry dressing over area left lower leg apparently this is followed by wound care.  Her eyes pupils appear equal round reactive to light sclera and conjunctiva are clear.  Oropharynx clear mucous membranes moist.  Chest is clear to auscultation there is no labored breathing.  Heart is regular rate and rhythm without murmur gallop or rub she appears to have some chronic venous stasis changes I would say 1-2+ edema lower extremities apparently this is fairly baseline.  Abdomen is soft nontender positive bowel sounds.  Muscle skeletal does ambulate in a wheelchair I do not note any deformities moves all her extremities x4.  GU-somewhat limited exam since patient in wheelchair but could not appreciate any suprapubic tenderness  Neurologic appears grossly intact she is alert.  Psych she is oriented to self only.  Labs.  01/13/2014.  Urine culture grew greater than 100,000 colonies of Escherichia coli.  Hemoglobin A1c 5.5.  Liver function tests within normal limits except albumin of 3.3.  01/01/2014.  Sodium 135 potassium 4.6 BUN 12 creatinine 0.7.  10/19/2013.  WBC 6.6 hemoglobin 12.2 platelets 202.    Assessment and plan.  #1-UTI-this appears to be relatively asymptomatic she is finishing a course of amoxicillin--however sitters tells me she still has some increased agitation at times will recheck urine culture  #2-dementia-she is on Exelon-decreased agitation may be caused by progressive dementia as well Will monitor for now-she was pleasant during exam tonight.  #3 hypertension this appears fairly well controlled as noted above she is  on Losartan  #4-history of venous stasis changes lower extremity she is on Lasix 30 mg a day this appears relatively stable past we'll update a metabolic panel.  #5-depression that she continues on Lexapro again also is on Exelon for coexistent  dementia.  #6 status constipation-apparently this has been stable she is on senna.  [id number]

## 2014-03-06 ENCOUNTER — Encounter: Payer: Self-pay | Admitting: Internal Medicine

## 2014-03-06 ENCOUNTER — Non-Acute Institutional Stay (SKILLED_NURSING_FACILITY): Payer: Medicare HMO | Admitting: Internal Medicine

## 2014-03-06 DIAGNOSIS — L259 Unspecified contact dermatitis, unspecified cause: Secondary | ICD-10-CM

## 2014-03-06 DIAGNOSIS — R739 Hyperglycemia, unspecified: Secondary | ICD-10-CM

## 2014-03-06 DIAGNOSIS — R7309 Other abnormal glucose: Secondary | ICD-10-CM

## 2014-03-06 DIAGNOSIS — N390 Urinary tract infection, site not specified: Secondary | ICD-10-CM

## 2014-03-06 DIAGNOSIS — L309 Dermatitis, unspecified: Secondary | ICD-10-CM

## 2014-03-06 HISTORY — DX: Hyperglycemia, unspecified: R73.9

## 2014-03-06 NOTE — Progress Notes (Signed)
Patient ID: Sandra Holland, female   DOB: 04/02/1924, 80 y.o.   MRN: [id number]   This is an acute visit.  Level of care skilled.  Facility AF.  Chief complaint-acute visit followup neck lesion-UTI-question hyperglycemia.  History of present illness.  Patient is a pleasant elderly resident with a history of dementia hypertension and chronic kidney disease as well as chronic venous stasis.  She recently was treated for UTI with mental status changes-she actually received 2 courses of antibiotics since repeat culture grew out a different organism and apparently she still had some increased confusion from baseline-this appears to have resolved She has baseline dementia but is not increasingly agitated which was the case previously.  She also had a metabolic panel earlier this month which showed an elevated glucose in the mid 100s-however a fasting glucose was only 78.--Per chart review it appears she does have some history of diabetes type 2-however hemoglobin A1c back in April was only 5.5  Nursing staff is also left a note about a lesion on her right neck they would like assessed there was some thought possibly this was a bug bite and apparently this was noted several days ago and has not increased in redness or size.  Patient just states it feels a bit irritated there.  Family medical social history has been her previous progress notes most recently 02/18/2014.  Medications have been reviewed per MAR.  Review of systems.  Somewhat limited secondary to dementia.  In general she is denying any fever or chills.  Respiratory no shortness of breath or cough.  Cardiac does not complaining of chest pain.  GI he does not complain of any abdominal discomfort nausea or vomiting.  GU is not complaining of any burning with urination or dysuria.  Neurologic no complaints of headache or dizziness.  Skin-does have an area right side of her neck she says feels irritated at  times  Physical exam.  Temperature 96.8 pulse 60 respirations 20 blood pressure 145/81-121/73 in this range I do not see consistent elevations.  In general this is a pleasant elderly female no distress sitting comfortably in her wheelchair.  Her skin is warm and dry-I note on the right side of her neck there is a very small almost pimple type area -- there is no surrounding erythema no acute tenderness or edema-this appears possibly to be a  small skin tag  Oropharynx is clear mucous membranes moist.  Chest is clear to auscultation no labored breathing.  Heart is regular rate and rhythm without murmur gallop or rub she has chronic venous stasis changes I would say 2+ lower extremity edema this appears relatively baseline.  Her abdomen is soft nontender with positive bowel sounds.  Musculoskeletal continues to ambulate in wheelchair moves her extremities at baseline without any deformities noted.  GU-does not have suprapubic tenderness.  Neurologic she appears grossly intact her speech is clear.  Psych she is oriented to self pleasant smiling engaged.  Labs.   02/20/2014.  WBCs 6.7 hemoglobin 15.4 platelets 251.  Sodium 133 potassium 4.1 BUN 14 creatinine 0.71 of note glucose on that panel was 148.  Of note followup fasting glucose however was 78--and hemoglobin A1c in April was only 5.5.  Assessment and plan.  #1-dermatitis-at this point continue to monitor appears to be a small skin tag if it continues to cause irritation certainly consider removal--Will discuss with staff possibly putting a little protective dressing over it as needed  #2-hyperglycemia-this does not appear to be an  issue per fasting glucose as certainly would be hesitant to be real aggressive here with a fasting glucose of 78   #3-history UTI-this appears resolved clinically she does not complaining of symptoms apparently her increased confusion and agitation has resolved--she is pleasant and engaging this  afternoon.  [id number]

## 2014-04-05 IMAGING — CR DG KNEE COMPLETE 4+V*R*
1 series · 1 of 1 positions shown · non-contrast
Comparison: None.

CLINICAL DATA: Fell out of wheelchair and injured right knee.

RIGHT KNEE - COMPLETE 4+ VIEW

[view not recorded]
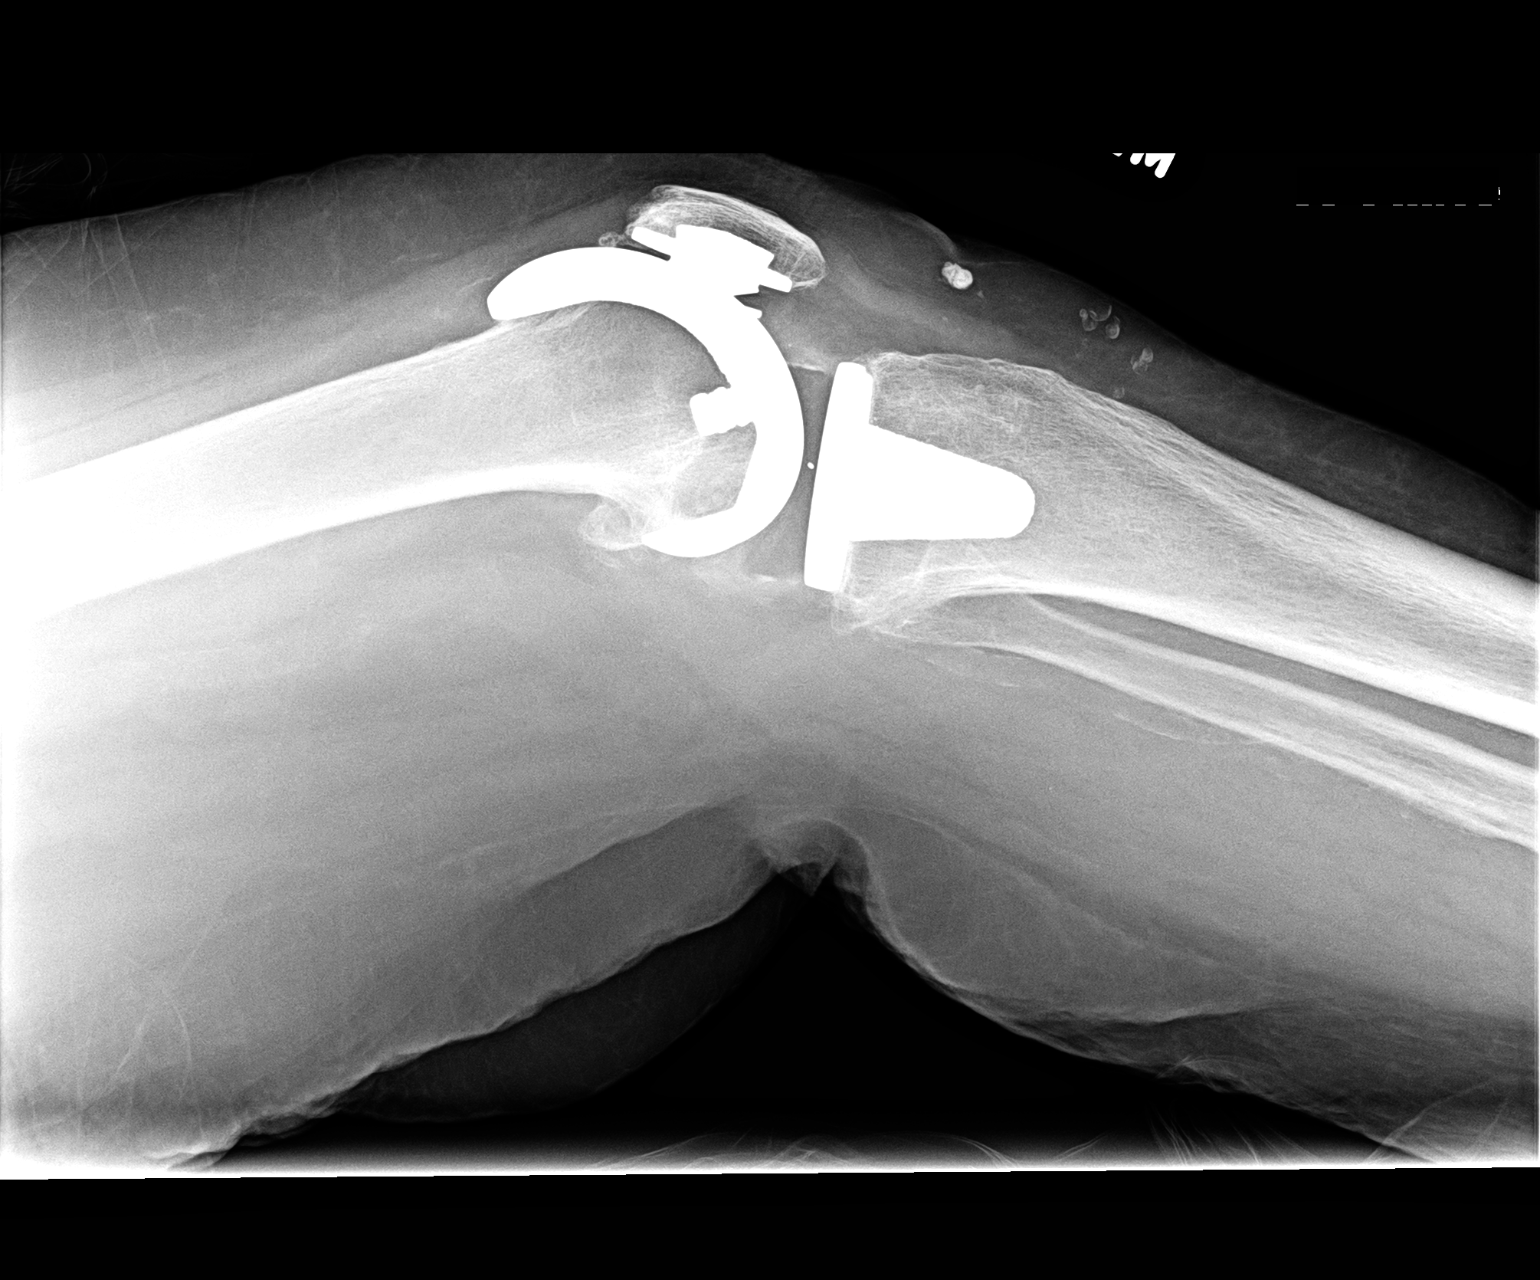

[1 of 1 positions shown; findings below may reference images not displayed]

FINDINGS: Prior right total knee arthroplasty anatomic alignment.
No complicating features.  No evidence of acute fracture or
dislocation.  Osteopenia.  Phleboliths in the subcutaneous tissues.
Femoropopliteal and tibioperoneal artery atherosclerosis.
IMPRESSION: No acute osseous abnormality.  Osteopenia.  Anatomic alignment of
the right knee arthroplasty without complicating features.

## 2014-04-20 ENCOUNTER — Non-Acute Institutional Stay (SKILLED_NURSING_FACILITY): Payer: Medicare HMO | Admitting: Internal Medicine

## 2014-04-20 DIAGNOSIS — F329 Major depressive disorder, single episode, unspecified: Secondary | ICD-10-CM

## 2014-04-20 DIAGNOSIS — G309 Alzheimer's disease, unspecified: Principal | ICD-10-CM

## 2014-04-20 DIAGNOSIS — R609 Edema, unspecified: Secondary | ICD-10-CM

## 2014-04-20 DIAGNOSIS — I10 Essential (primary) hypertension: Secondary | ICD-10-CM

## 2014-04-20 DIAGNOSIS — L309 Dermatitis, unspecified: Secondary | ICD-10-CM

## 2014-04-20 DIAGNOSIS — F3289 Other specified depressive episodes: Secondary | ICD-10-CM

## 2014-04-20 DIAGNOSIS — L259 Unspecified contact dermatitis, unspecified cause: Secondary | ICD-10-CM

## 2014-04-20 DIAGNOSIS — F0280 Dementia in other diseases classified elsewhere without behavioral disturbance: Secondary | ICD-10-CM

## 2014-04-20 NOTE — Progress Notes (Signed)
Patient ID: Sandra Holland, female   DOB: 12/05/1923, 80 y.o.   MRN: [id number]   this is a routine visit.  Level of care skilled.  Facility AF .  Complaint-medical management of dementia hypertension depression constipation.   History of present illness.  Patient is a pleasant 80 year old female with the above diagnoses-she appears to be stable with no complaints of fever chills or discomfort she is sitting comfortably in her wheelchair today she has a sitter with her-she did have a recent UTI this was treated and she is not complaining of any back pain or dysuria today apparently she had had some increased confusion and agitation-when she had the UTI-but apparently this is stabilized.  H  She does have some history of lower extremity edema it appears as on low dose Lasix apparently this has been fairly stable as well She does have some history of nighttime pain ---has when necessary Tylenol which gives her relief she takes this apparently every night according to nursing staff-we will make this routine  Patient's BuSpar was recently increased to twice a day apparently because of increased anxiety-picking at her skin etc.--Apparently this is helping some  .  Family medical social history has been reviewed per previous progress note including 12/05/2013--01/08/2014--and 02/18/2014 .  Medications have been reviewed per MAR .  Review of systems Limited secondary to patient being a poor historian with dementia.  In general no fever chills noted.  Respiratory no increased shortness of breath or cough. As been noted by staff.  Cardiac she does not complaining of chest pain--appears to have some chronic venous stasis changes per chart review  GI no complaints of nausea vomiting or abdominal pain--she does have a history of constipation and apparently this has been stable currently on senna.  GU no complaints of dysuria today.  Neurologic she is not complaining of any dizziness or headache    Psych she does have a history of dementia but is quite pleasant and appropriate during exam .  Physical exam.  Temperature 98.8 pulse 84 respirations 20 blood pressure 144/88.  In general this is a pleasant elderly female in no distress sitting comfortably in her wheelchair.  Her skin is warm and dry--she does appear to have an erythematous macular papular rash on her back-.  Her eyes pupils appear equal round reactive to light sclera and conjunctiva are clear.  Oropharynx clear mucous membranes moist.  Chest is clear to auscultation there is no labored breathing.  Heart is regular rate and rhythm without murmur gallop or rub she appears to have some chronic venous stasis changes I would say 1-2+ edema lower extremities apparently this is fairly baseline--her legs are wrapped this evening.  Abdomen is soft nontender positive bowel sounds.  Muscle skeletal does ambulate in a wheelchair I do not note any deformities moves all her extremities x4.     Neurologic appears grossly intact she is alert.  Psych she is oriented to self only .  Labs.  03/03/2014.  Fasting glucose 78.  02/20/2014.  WBC 6.7 hemoglobin 14.5 platelets 251.  Sodium 131 potassium 4.1 BUN 14 creatinine 0.71   01/13/2014.  Urine culture grew greater than 100,000 colonies of Escherichia coli.  Hemoglobin A1c 5.5.  Liver function tests within normal limits except albumin of 3.3.  01/01/2014.  Sodium 135 potassium 4.6 BUN 12 creatinine 0.7.  10/19/2013.  WBC 6.6 hemoglobin 12.2 platelets 202.   Assessment and plan.  #1- Dermatitis  of her back we will treat with  Nizoral cream twice a day if no resolution notify provider  #2-dementia-she is on Exelon-appears to be stable she recently had some increased agitation but apparently this improved with treatment for UTI   #3 hypertension--continues on Losartan -systolic is mildly elevated-will order vital signs daily with a log in provider book for review  Next week    so we can get some more readings before making any medication adjustments   #4-history of venous stasis changes lower extremity she is on Lasix 30 mg a day this appears relatively stable past we'll update a metabolic panel .  #5-depression that she continues on Lexapro again also is on Exelon for coexistent dementia .  #6  constipation-apparently this has been stable she is on senna  #7-history of chronic pain-she does have an order for Tylenol each bedtime when necessary will make this routine since patient uses it every night and  gives her relief  #8-anxiety- BuSpar has been increased   . Of note will order a CBC and CMP for updated values    [phone] [id number]

## 2014-04-28 ENCOUNTER — Encounter: Payer: Self-pay | Admitting: Internal Medicine

## 2014-06-10 ENCOUNTER — Non-Acute Institutional Stay (SKILLED_NURSING_FACILITY): Payer: Medicare HMO | Admitting: Internal Medicine

## 2014-06-10 ENCOUNTER — Encounter: Payer: Self-pay | Admitting: Internal Medicine

## 2014-06-10 DIAGNOSIS — N390 Urinary tract infection, site not specified: Secondary | ICD-10-CM

## 2014-06-10 DIAGNOSIS — F3289 Other specified depressive episodes: Secondary | ICD-10-CM

## 2014-06-10 DIAGNOSIS — G309 Alzheimer's disease, unspecified: Secondary | ICD-10-CM

## 2014-06-10 DIAGNOSIS — M810 Age-related osteoporosis without current pathological fracture: Secondary | ICD-10-CM

## 2014-06-10 DIAGNOSIS — I10 Essential (primary) hypertension: Secondary | ICD-10-CM

## 2014-06-10 DIAGNOSIS — F0280 Dementia in other diseases classified elsewhere without behavioral disturbance: Secondary | ICD-10-CM

## 2014-06-10 DIAGNOSIS — F329 Major depressive disorder, single episode, unspecified: Secondary | ICD-10-CM

## 2014-06-10 HISTORY — DX: Age-related osteoporosis without current pathological fracture: M81.0

## 2014-06-10 NOTE — Assessment & Plan Note (Addendum)
Cx + pansensitive E.Coli; will tx with cipro 500 BID for 7 days

## 2014-06-10 NOTE — Assessment & Plan Note (Signed)
Controlled with lasix and Cozaar

## 2014-06-10 NOTE — Assessment & Plan Note (Signed)
Continue exelon 

## 2014-06-10 NOTE — Assessment & Plan Note (Signed)
Continue lexapro; pt participating in activities

## 2014-06-10 NOTE — Assessment & Plan Note (Signed)
Continue oscal and vitamin D

## 2014-06-10 NOTE — Progress Notes (Signed)
MRN: [id number] Name: Sandra Holland  Sex: female Age: 80 y.o. DOB: 02/05/1924  [facility] #: [facility] Facility/Room: 319 Level Of Care: SNF Provider: Merrilee Seashore D Emergency Contacts: Extended Emergency Contact Information Primary Emergency Contact: Simmons,Tanya Address: [address]          [place], [place] [address] [place] Home Phone: [phone] Mobile Phone: [phone] Relation: Grandaughter Secondary Emergency Contact: Lenard Galloway Address: [address]          [place], [place] [address] [place] Home Phone: [phone] Relation: Daughter     Allergies: Hydrocodone and Prednisone  Chief Complaint  Patient presents with  . Acute Visit  . Medical Management of Chronic Issues    HPI: Patient is 80 y.o. female who is being seen for a UTI and chronic problems.  Past Medical History  Diagnosis Date  . Dementia   . Hypertension   . Overactive bladder   . DNR (do not resuscitate)     Past Surgical History  Procedure Laterality Date  . Fracture surgery    . Joint replacement        Medication List       This list is accurate as of: 06/10/14 11:59 PM.  Always use your most recent med list.               ABC PLUS SENIOR Tabs  Take 1 tablet by mouth daily.     acetaminophen 325 MG tablet  Commonly known as:  TYLENOL  Take 650 mg by mouth every 6 (six) hours as needed. For pain     busPIRone 5 MG tablet  Commonly known as:  BUSPAR  Take 5 mg by mouth 2 (two) times daily.     calcium-vitamin D 500-200 MG-UNIT per tablet  Commonly known as:  OSCAL WITH D  Take 1 tablet by mouth daily.     escitalopram 10 MG tablet  Commonly known as:  LEXAPRO  Take 20 mg by mouth daily.     food thickener Powd  Commonly known as:  THICK IT  Take 1 Container by mouth as needed (for nectar thick).     furosemide 20 MG tablet  Commonly known as:  LASIX  Take 30 mg by mouth daily.     losartan 50 MG tablet   Commonly known as:  COZAAR  Take 50 mg by mouth daily.     rivastigmine 9.5 mg/24hr  Commonly known as:  EXELON  Place 1 patch (9.5 mg total) onto the skin daily.     senna-docusate 8.6-50 MG per tablet  Commonly known as:  Senokot-S  Take 2 tablets by mouth daily.     Vitamin D-3 5000 UNITS Tabs  Take 1 tablet by mouth once a week.        No orders of the defined types were placed in this encounter.    Immunization History  Administered Date(s) Administered  . Tdap 04/10/2012    History  Substance Use Topics  . Smoking status: Never Smoker   . Smokeless tobacco: Never Used  . Alcohol Use: No    Review of Systems  ROS from sitter GENERAL: Feels well no fevers, fatigue, appetite changes SKIN: No itching, rash HEENT: No complaint RESPIRATORY: No cough, wheezing, SOB CARDIAC: No chest pain, palpitations, lower extremity edema  GI: No abdominal pain, No N/V/D or constipation, No heartburn or reflux  GU: + dysuria, frequency, no incontinence  MUSCULOSKELETAL: No unrelieved bone/joint pain NEUROLOGIC:  No headache, dizziness or focal weakness PSYCHIATRIC: No overt anxiety or sadness. Sleeps well.   Filed Vitals:   06/10/14 1733  BP: 140/72  Pulse: 71  Temp: 98.1 F (36.7 C)  Resp: 16    Physical Exam  GENERAL APPEARANCE: Alert, modconversant. Appropriately groomed. No acute distress  SKIN: No diaphoresis rash,  HEENT: Unremarkable RESPIRATORY: Breathing is even, unlabored. Lung sounds are clear   CARDIOVASCULAR: Heart RRR no murmurs, rubs or gallops. 1+ peripheral edema  GASTROINTESTINAL: Abdomen is soft, non-tender, not distended w/ normal bowel sounds.  GENITOURINARY: Bladder non tender, not distended  MUSCULOSKELETAL: No abnormal joints or musculature NEUROLOGIC: Cranial nerves 2-12 grossly intact. Moves all extremities no tremor. PSYCHIATRIC: confused, no behavioral issues  Patient Active Problem List   Diagnosis Date Noted  . Osteoporosis  06/10/2014  . Hyperglycemia 03/06/2014  . Dermatitis 03/06/2014  . Chronic venous insufficiency 12/08/2013  . Altered mental status 10/20/2013  . Urinary tract infection, site not specified 05/01/2013  . Edema 04/02/2013  . Dysphagia 04/02/2013  . Depressive disorder, not elsewhere classified 02/25/2013  . Unspecified constipation 02/25/2013  . Essential hypertension, benign 02/25/2013  . Alzheimer's disease 02/25/2013    CBC    Component Value Date/Time   WBC 6.6 06/02/2012 0950   RBC 4.29 06/02/2012 0950   HGB 14.0 06/02/2012 0950   HCT 40.0 06/02/2012 0950   PLT 195 06/02/2012 0950   MCV 93.2 06/02/2012 0950   LYMPHSABS 1.3 06/02/2012 0950   MONOABS 0.9 06/02/2012 0950   EOSABS 0.1 06/02/2012 0950   BASOSABS 0.1 06/02/2012 0950    CMP     Component Value Date/Time   NA 132* 06/02/2012 0950   K 4.2 06/02/2012 0950   CL 94* 06/02/2012 0950   CO2 31 06/02/2012 0950   GLUCOSE 93 06/02/2012 0950   BUN 10 06/02/2012 0950   CREATININE 0.60 06/02/2012 0950   CALCIUM 9.4 06/02/2012 0950   PROT 6.4 06/02/2012 0950   ALBUMIN 3.6 06/02/2012 0950   AST 19 06/02/2012 0950   ALT 7 06/02/2012 0950   ALKPHOS 77 06/02/2012 0950   BILITOT 0.5 06/02/2012 0950   GFRNONAA 80* 06/02/2012 0950   GFRAA >90 06/02/2012 0950    Assessment and Plan  Urinary tract infection, site not specified Cx + pansensitive E.Coli; will tx with cipro 500 BID for 7 days  Essential hypertension, benign Controlled with lasix and Cozaar  Alzheimer's disease Continue exelon  Depressive disorder, not elsewhere classified Continue lexapro; pt participating in activities  Osteoporosis Continue oscal and vitamin D    Margit Hanks, MD

## 2014-06-13 ENCOUNTER — Encounter: Payer: Self-pay | Admitting: Internal Medicine

## 2014-08-10 ENCOUNTER — Non-Acute Institutional Stay (SKILLED_NURSING_FACILITY): Payer: Medicare HMO | Admitting: Internal Medicine

## 2014-08-10 DIAGNOSIS — R609 Edema, unspecified: Secondary | ICD-10-CM

## 2014-08-10 DIAGNOSIS — I10 Essential (primary) hypertension: Secondary | ICD-10-CM

## 2014-08-10 DIAGNOSIS — G309 Alzheimer's disease, unspecified: Secondary | ICD-10-CM

## 2014-08-10 DIAGNOSIS — M810 Age-related osteoporosis without current pathological fracture: Secondary | ICD-10-CM

## 2014-08-10 DIAGNOSIS — F0280 Dementia in other diseases classified elsewhere without behavioral disturbance: Secondary | ICD-10-CM

## 2014-08-10 NOTE — Progress Notes (Signed)
Patient ID: Sandra Holland Dimario, female   DOB: 03/11/1924, 80 y.o.   MRN: [id number] Patient ID: Sandra Holland Aguon, female   DOB: 01/30/1924, 80 y.o.   MRN: [id number]   this is a routine visit.  Level of care skilled.  Facility AF .  Complaint-medical management of dementia hypertension depression constipation.   History of present illness.  Patient is a pleasant 80 year old female with the above diagnoses-she appears to be stable with no complaints of fever chills or discomfort she is sitting comfortably in her wheelchair today she has a sitter with her-  H  She does have some history of lower extremity edema it appears as on low dose Lasix apparently this has been fairly stable as well--nursing staff from the weekend has left a note about some possible increased discomfort but does does not appear to be significant this evening-she was seen by the wound care nurse earlier today currently her legs are wrapped- She does have some history of nighttime pain ---has Tylenol which has been made routine apparently this gives some relief   When I last saw her she had apparently some assistance scratching this was thought possibly anxiety related-her BuSpar was changed and this appears to be healthy  .  Family medical social history has been reviewed per previous progress notes 07/13/2014 .  Medications have been reviewed per MAR .  Review of systems Limited secondary to patient being a poor historian with dementia.  In general no fever chills noted.  Respiratory no increased shortness of breath or cough. As been noted by staff--she does have an intermittent cough which is not new.  Cardiac she does not complaining of chest pain--appears to have some chronic venous stasis changes --she is followed by wound care GI no complaints of nausea vomiting or abdominal pain--she does have a history of constipation and apparently this has been stable currently on senna.  GU no complaints of dysuria today.    Neurologic she is not complaining of any dizziness or headache  Psych she does have a history of dementia but is quite pleasant and appropriate during exam .  Physical exam.  Temperature 99.0 pulse 67 respirations 20 blood pressure 139/65  In general this is a pleasant elderly female in no distress sitting comfortably in her wheelchair.  Her skin is warm and dry--her lower legs are wrapped  and not assessed-she was assessed by wound care nurse earlier today.  Her eyes pupils appear equal round reactive to light sclera and conjunctiva are clear.  Oropharynx clear mucous membranes moist.  Chest is clear to auscultation there is no labored breathing.  Heart is regular rate and rhythm with a 1-2/6 systolic murmur she appears to have some chronic venous stasis changes I would say 1-2+ edema lower extremities apparently this is fairly baseline--her legs are wrapped this evening.  Abdomen is soft nontender positive bowel sounds.  Muscle skeletal does ambulate in a wheelchair I do not note any deformities moves all her extremities x4.     Neurologic appears grossly intact she is alert.  Psych she is oriented to self only .  Labs.  04/23/2014.  WBC 6.7 hemoglobin 12.5 platelets 173.  Sodium 135 potassium 4.3 BUN 15 creatinine 0.6.  Liver function tests within normal limits except albumin of 3.2.  03/03/2014.  Fasting glucose 78.    03/03/2014.  Fasting glucose 78.  02/20/2014.  WBC 6.7 hemoglobin 14.5 platelets 251.  Sodium 131 potassium 4.1 BUN 14 creatinine 0.71   01/13/2014.  Urine culture grew greater than 100,000 colonies of Escherichia coli.  Hemoglobin A1c 5.5.  Liver function tests within normal limits except albumin of 3.3.  01/01/2014.  Sodium 135 potassium 4.6 BUN 12 creatinine 0.7.  10/19/2013.  WBC 6.6 hemoglobin 12.2 platelets 202.   Assessment and plan.    #1-dementia-she is on Exelon-appears to be stable she continues to have sitters-I'm not aware of  any acute behaviors   #2 hypertension--continues on Losartan -blood pressure tonight 139/65-at this point will monitor w   #3-history of venous stasis changes lower extremity she is on Lasix 30 mg a day this appears relatively stable --and is followed by wound care-- we'll update a metabolic panel  #4 osteoporosis this is stable on Os-Cal with vitamin D.--Will update vitamin D level  #5-history of osteoarthritic pain she is receiving Tylenol apparently with good effect  -- .  #6-depression that she continues on Lexapro again also is on Exelon for coexistent dementia .  #7  constipation-apparently this has been stable she is on senna e  #8-anxiety-  Stable on  BuSpar      . Of note will order a CBC and CMP for updated values as well as a vitamin D level  [id number]    \

## 2014-09-22 ENCOUNTER — Non-Acute Institutional Stay (SKILLED_NURSING_FACILITY): Payer: Medicare HMO | Admitting: Internal Medicine

## 2014-09-22 ENCOUNTER — Encounter: Payer: Self-pay | Admitting: Internal Medicine

## 2014-09-22 DIAGNOSIS — N390 Urinary tract infection, site not specified: Secondary | ICD-10-CM

## 2014-09-22 NOTE — Progress Notes (Signed)
MRN: [id number] Name: Sandra Holland  Sex: female Age: 81 y.o. DOB: 12-13-1923  [facility] Facility/Room: 319 Level Of Care: SNF Provider: Merrilee Seashore Holland Emergency Contacts: Extended Emergency Contact Information Primary Emergency Contact: Holland,Sandra Address: [address]          [place], [place] [address] [place] Home Phone: [phone] Mobile Phone: [phone] Relation: Grandaughter Secondary Emergency Contact: Sandra Holland Address: [address]          [place], [place] [address] [place] Home Phone: [phone] Relation: Daughter   Allergies: Hydrocodone and Prednisone  Chief Complaint  Patient presents with  . Acute Visit    HPI: Patient is 81 y.o. female who is being seen for ESBL, E. Coli UTI.  Past Medical History  Diagnosis Date  . Dementia   . Hypertension   . Overactive bladder   . DNR (do not resuscitate)     Past Surgical History  Procedure Laterality Date  . Fracture surgery    . Joint replacement        Medication List       This list is accurate as of: 09/22/14 10:04 PM.  Always use your most recent med list.               ABC PLUS SENIOR Tabs  Take 1 tablet by mouth daily.     acetaminophen 325 MG tablet  Commonly known as:  TYLENOL  Take 650 mg by mouth every 6 (six) hours as needed. For pain     busPIRone 5 MG tablet  Commonly known as:  BUSPAR  Take 5 mg by mouth 2 (two) times daily.     calcium-vitamin Holland 500-200 MG-UNIT per tablet  Commonly known as:  OSCAL WITH Holland  Take 1 tablet by mouth daily.     escitalopram 10 MG tablet  Commonly known as:  LEXAPRO  Take 20 mg by mouth daily.     food thickener Powd  Commonly known as:  THICK IT  Take 1 Container by mouth as needed (for nectar thick).     furosemide 20 MG tablet  Commonly known as:  LASIX  Take 30 mg by mouth daily.     losartan 50 MG tablet  Commonly known as:  COZAAR  Take 50 mg by mouth daily.      rivastigmine 9.5 mg/24hr  Commonly known as:  EXELON  Place 1 patch (9.5 mg total) onto the skin daily.     senna-docusate 8.6-50 MG per tablet  Commonly known as:  Senokot-S  Take 2 tablets by mouth daily.     Vitamin Holland-3 5000 UNITS Tabs  Take 1 tablet by mouth once a week.        No orders of the defined types were placed in this encounter.    Immunization History  Administered Date(s) Administered  . Influenza-Unspecified 07/06/2014  . Tdap 04/10/2012    History  Substance Use Topics  . Smoking status: Never Smoker   . Smokeless tobacco: Never Used  . Alcohol Use: No    Review of Systems  DATA OBTAINED: from  nurse GENERAL:  no fevers, fatigue, appetite changes SKIN: No itching, rash HEENT: No complaint RESPIRATORY: No cough, wheezing, SOB CARDIAC: No chest pain, palpitations, lower extremity edema  GI: No abdominal pain, No N/V/Holland or constipation, No heartburn or reflux  GU: No dysuria, frequency or urgency, or incontinence  MUSCULOSKELETAL: No unrelieved bone/joint pain NEUROLOGIC: No headache, dizziness  PSYCHIATRIC: No overt anxiety or sadness  Filed Vitals:   09/22/14 1412  BP: 141/83  Pulse: 74  Temp: 97.5 F (36.4 C)  Resp: 18    Physical Exam  GENERAL APPEARANCE: Alert, conversant, No acute distressWF, sitting in WC  SKIN: No diaphoresis rash, or wounds HEENT: Unremarkable RESPIRATORY: Breathing is even, unlabored. Lung sounds are clear   CARDIOVASCULAR: Heart RRR no murmurs, rubs or gallops. No peripheral edema  GASTROINTESTINAL: Abdomen is soft, non-tender, not distended w/ normal bowel sounds.  GENITOURINARY: Bladder non tender, not distended  MUSCULOSKELETAL: No abnormal joints or musculature NEUROLOGIC: Cranial nerves 2-12 grossly intact. Moves all extremities PSYCHIATRIC: dementia, pleasantly confused, no behavioral issues  Patient Active Problem List   Diagnosis Date Noted  . Osteoporosis 06/10/2014  . Hyperglycemia 03/06/2014   . Dermatitis 03/06/2014  . Chronic venous insufficiency 12/08/2013  . Altered mental status 10/20/2013  . Complicated UTI (urinary tract infection) 05/01/2013  . Edema 04/02/2013  . Dysphagia 04/02/2013  . Depressive disorder, not elsewhere classified 02/25/2013  . Unspecified constipation 02/25/2013  . Essential hypertension, benign 02/25/2013  . Alzheimer's disease 02/25/2013    CBC    Component Value Date/Time   WBC 6.6 06/02/2012 0950   RBC 4.29 06/02/2012 0950   HGB 14.0 06/02/2012 0950   HCT 40.0 06/02/2012 0950   PLT 195 06/02/2012 0950   MCV 93.2 06/02/2012 0950   LYMPHSABS 1.3 06/02/2012 0950   MONOABS 0.9 06/02/2012 0950   EOSABS 0.1 06/02/2012 0950   BASOSABS 0.1 06/02/2012 0950    CMP     Component Value Date/Time   NA 132* 06/02/2012 0950   K 4.2 06/02/2012 0950   CL 94* 06/02/2012 0950   CO2 31 06/02/2012 0950   GLUCOSE 93 06/02/2012 0950   BUN 10 06/02/2012 0950   CREATININE 0.60 06/02/2012 0950   CALCIUM 9.4 06/02/2012 0950   PROT 6.4 06/02/2012 0950   ALBUMIN 3.6 06/02/2012 0950   AST 19 06/02/2012 0950   ALT 7 06/02/2012 0950   ALKPHOS 77 06/02/2012 0950   BILITOT 0.5 06/02/2012 0950   GFRNONAA 80* 06/02/2012 0950   GFRAA >90 06/02/2012 0950    Assessment and Plan  Complicated UTI (urinary tract infection) >100,000 E Coli ESBL resistant to most; only saving grace is pt is NOT PCN allergic so able to tx with augmentin 875 mg BID for 7 days    Sandra Holland, Sandra Holland D, MD

## 2014-09-22 NOTE — Assessment & Plan Note (Addendum)
>  100,000 E Coli ESBL resistant to most; only saving grace is pt is NOT PCN allergic so able to tx with augmentin 875 mg BID for 7 days

## 2014-11-05 ENCOUNTER — Non-Acute Institutional Stay (SKILLED_NURSING_FACILITY): Payer: Medicare HMO | Admitting: Internal Medicine

## 2014-11-05 DIAGNOSIS — G309 Alzheimer's disease, unspecified: Secondary | ICD-10-CM

## 2014-11-05 DIAGNOSIS — M810 Age-related osteoporosis without current pathological fracture: Secondary | ICD-10-CM

## 2014-11-05 DIAGNOSIS — I10 Essential (primary) hypertension: Secondary | ICD-10-CM

## 2014-11-05 DIAGNOSIS — F0280 Dementia in other diseases classified elsewhere without behavioral disturbance: Secondary | ICD-10-CM

## 2014-11-05 DIAGNOSIS — R609 Edema, unspecified: Secondary | ICD-10-CM

## 2014-11-05 NOTE — Progress Notes (Signed)
Patient ID: Sandra Holland, female   DOB: 09/11/1924, 81 y.o.   MRN: [id number]   this is a routine visit.  Level of care skilled.  Facility AF .  Complaint-medical management of dementia hypertension depression constipation.   History of present illness.  Patient is a pleasant 81 year old female with the above diagnoses-she appears to be stable with no complaints of fever chills or discomfort she is sitting comfortably in her wheelchair today --she has a sitter with her later in the afternoon  H  She does have some history of lower extremity edema it appears as on low dose Lasix apparently this has been fairly stable as well--  [name]\She does have a history of UTIs was treated approximately 6 weeks ago for Escherichia coli ESBL this appears to have resolved uneventfully  She does have some history of nighttime pain ---has Tylenol which has been made routine apparently this gives some relief     .  Family medical social history has been reviewed per previous progress notes 07/13/2014--09/22/2014 .  Medications have been reviewed per MAR .  Review of systems Limited secondary to patient being a poor historian with dementia. --obtained as well from nursing staff In general no fever chills noted.  Respiratory no increased shortness of breath --she does have an intermittent cough which is not new.  Cardiac she does not complaining of chest pain--appears to have some chronic venous stasis changes --she is followed by wound care GI no complaints of nausea vomiting or abdominal pain--she does have a history of constipation and apparently this has been stable currently on senna.  GU no complaints of dysuria today.  Neurologic she is not complaining of any dizziness or headache  Psych she does have a history of dementia but is quite pleasant and appropriate during exam .  Physical exam.  Dr. 97.1 pulse 68 respirations 18 blood pressure 120/72 this appears already relatively baseline recent  blood pressures 120/72-98/70-133/73 I do not see consistent highs or lows In general this is a pleasant elderly female in no distress sitting comfortably in her wheelchair.  Her skin is warm and dry--her lower legs are wrapped  and not assessed-she is followed by wound care.  Her eyes pupils appear equal round reactive to light sclera and conjunctiva are clear.  Oropharynx clear mucous membranes moist.  Chest is clear to auscultation there is no labored breathing.  Heart is regular rate and rhythm with a 1-2/6 systolic murmur she appears to have some chronic venous stasis changes I would say 1-2+ edema lower extremities apparently this is fairly baseline--her legs are wrapped this afternoon.  Abdomen is soft nontender positive bowel sounds.  Muscle skeletal does ambulate in a wheelchair I do not note any deformities moves all her extremities x4.     Neurologic appears grossly intact she is alert.  Psych she is oriented to self only .  Labs.  08/13/2014.  Sodium 137 potassium 4.5 BUN 13 creatinine 0.6.  Albumin 3.4 otherwise liver function tests within normal limits  WBC 7.2 hemoglobin 12.6 platelets 208  04/23/2014.  WBC 6.7 hemoglobin 12.5 platelets 173.  Sodium 135 potassium 4.3 BUN 15 creatinine 0.6.  Liver function tests within normal limits except albumin of 3.2.  03/03/2014.  Fasting glucose 78.    03/03/2014.  Fasting glucose 78.  02/20/2014.  WBC 6.7 hemoglobin 14.5 platelets 251.  Sodium 131 potassium 4.1 BUN 14 creatinine 0.71   01/13/2014.  Urine culture grew greater than 100,000 colonies of Escherichia coli.  Hemoglobin A1c 5.5.  Liver function tests within normal limits except albumin of 3.3.  01/01/2014.  Sodium 135 potassium 4.6 BUN 12 creatinine 0.7.  10/19/2013.  WBC 6.6 hemoglobin 12.2 platelets 202.   Assessment and plan.    #1-dementia-she is on Exelon-appears to be stable she continues to have sitters-I'm not aware of any acute  behaviors   #2 hypertension--continues on Losartan -as stated above blood pressures appear to be fairly stable   #3-history of venous stasis changes lower extremity she is on Lasix 30 mg a day this appears relatively stable --and is followed by wound care-- we'll update a metabolic panel  #4 osteoporosis this is stable on Os-Cal with vitamin D.-She is on vitamin D supplement  #5-history of osteoarthritic pain she is receiving Tylenol apparently with good effect  -- .  #6-depression that she continues on Lexapro again also is on Exelon for coexistent dementia .  #7  constipation-apparently this has been stable she is on senna  #8-anxiety-  Stable on  BuSpar   #9 history of UTIs-again this has been stable for over a month recently treated for ESBL early January.  Of note Will update a CBC as well as a metabolic panel for updated values.  [id number]     . Of note will order a CBC and CMP for updated values as well as a vitamin D level  [id number]    \

## 2014-12-14 ENCOUNTER — Non-Acute Institutional Stay (SKILLED_NURSING_FACILITY): Payer: Medicare HMO | Admitting: Internal Medicine

## 2014-12-14 DIAGNOSIS — I872 Venous insufficiency (chronic) (peripheral): Secondary | ICD-10-CM

## 2014-12-14 DIAGNOSIS — M25552 Pain in left hip: Secondary | ICD-10-CM | POA: Diagnosis not present

## 2014-12-14 DIAGNOSIS — G309 Alzheimer's disease, unspecified: Secondary | ICD-10-CM

## 2014-12-14 DIAGNOSIS — F0280 Dementia in other diseases classified elsewhere without behavioral disturbance: Secondary | ICD-10-CM

## 2014-12-14 DIAGNOSIS — R609 Edema, unspecified: Secondary | ICD-10-CM

## 2014-12-14 DIAGNOSIS — I10 Essential (primary) hypertension: Secondary | ICD-10-CM | POA: Diagnosis not present

## 2014-12-14 NOTE — Progress Notes (Signed)
Patient ID: Sandra Holland, female   DOB: 08/07/1924, 81 y.o.   MRN: [id number]   his is a routine visit.  Level of care skilled.  Facility AF .  Complaint-medical management of dementia hypertension depression constipation.   History of present illness.  Patient is a pleasant 81 year old female with the above diagnoses-she appears to be stable--however her sitter noticed today she appeared to have possibly some pain of her left upper leg hip area when she stood up to use the toilet  Apparently there's been no history of fall-sitter has noted a small bruise on the left thigh  Currently she is resting in bed comfortably does not complain of pain  H  She does have some history of lower extremity edema it appears as on low dose Lasix apparently this has been fairly stable-  \She does have a history of UTIs was treated approximately 10 weeks ago for Escherichia coli ESBL this appears to have resolved uneventfully  She does have some history of nighttime pain ---has Tylenol which has been made routine apparently this gives some relief     .  Family medical social history has been reviewed per previous progress notes 07/13/2014--09/22/2014 11/05/2014 .  [facility] Tylenol 650 mg every 6 hours when necessary.  Os-Cal.  Lasix 30 mg daily.  Centrum Silver.  Lolsartan 50 mg daily.  Exelon patch.  Vitamin D.  Lexapro 15 mg daily.  Tylenol daily at bedtime.  BuSpar 5 mg twice a day .  Review of systems Limited secondary to patient being a poor historian with dementia. --obtained as well from sitter In general no fever chills noted.  Respiratory no increased shortness of breath --she does have an intermittent cough which is not new.  Cardiac she does not complaining of chest pain--appears to have some chronic venous stasis changes --she is followed by wound care GI no complaints of nausea vomiting or abdominal pain--she does have a  history of constipation and apparently this has been stable currently on senna.  GU no complaints of dysuria today.  Neurologic she is not complaining of any dizziness or headache Musculoskeletal-as noted above  Psych she does have a history of dementia but is quite pleasant and appropriate during exam .  Physical exam.   She is afebrile pulse 80 respirations of 18 blood pressure less noted 135/75 In general this is a pleasant elderly female in no distress currently lying in bed  Her skin is warm and dry--her lower legs are wrapped  and not assessed-she is followed by wound care There is a small bruise on the back of her left thigh-.  Her eyes pupils appear equal round reactive to light sclera and conjunctiva are clear.  Oropharynx clear mucous membranes moist.  Chest is clear to auscultation there is no labored breathing.  Heart is regular rate and rhythm with a 1-2/6 systolic murmur she appears to have some chronic venous stasis changes I would say 1-2+ edema lower extremities apparently this is fairly baseline--her legs are wrapped this afternoon.  Abdomen is soft nontender positive bowel sounds.  Muscle skeletal does ambulate in a wheelchair I did not note any deformities of the hip or pelvis area or thigh-I could not really appreciate any significant pain with gentle range of motion with flexion and extension at the hip as well as has her left knee      Neurologic appears grossly intact she is alert.  Psych she is oriented to self  only .  Labs.  11/06/2014.  Sodium 138 potassium 4.1 BUN 16 creatinine 0.6.  CBC 6.0 hemoglobin 13.0 platelets 182.  08/15/2014.  Liver function tests within normal limits except albumin of 3.4.    08/13/2014.  Sodium 137 potassium 4.5 BUN 13 creatinine 0.6.  Albumin 3.4 otherwise liver function tests within normal limits  WBC 7.2 hemoglobin 12.6 platelets 208  04/23/2014.  WBC 6.7 hemoglobin 12.5 platelets 173.  Sodium 135 potassium  4.3 BUN 15 creatinine 0.6.  Liver function tests within normal limits except albumin of 3.2.  03/03/2014.  Fasting glucose 78.    03/03/2014.  Fasting glucose 78.  02/20/2014.  WBC 6.7 hemoglobin 14.5 platelets 251.  Sodium 131 potassium 4.1 BUN 14 creatinine 0.71   01/13/2014.  Urine culture grew greater than 100,000 colonies of Escherichia coli.  Hemoglobin A1c 5.5.  Liver function tests within normal limits except albumin of 3.3.  01/01/2014.  Sodium 135 potassium 4.6 BUN 12 creatinine 0.7.  10/19/2013.  WBC 6.6 hemoglobin 12.2 platelets 202.   Assessment and plan.    #1-dementia-she is on Exelon-appears to be stable she continues to have sitters-I'm not aware of any acute behaviors   #2 hypertension--continues on Losartan -as stated above blood pressures appear to be fairly stable   #3-history of venous stasis changes lower extremity she is on Lasix 30 mg a day this appears relatively stable --and is followed by wound care-- we'll update a metabolic panel  #4 osteoporosis this is stable on Os-Cal with vitamin D.-She is on vitamin D supplement  #5-history of osteoarthritic pain she is receiving Tylenol apparently with good effect  -- .  #6-depression that she continues on Lexapro again also is on Exelon for coexistent dementia .  #7  constipation-apparently this has been stable she is on senna  #8-anxiety-  Stable on  BuSpar   #9 history of UTIs-again this has been stable  recently treated for ESBL early January.   #10-possible hip leg pain-will check an x-ray of the left leg hip to foot-including the tibia fibula femur and knee-physical exam was fairly benign and there is a small bruise of unknown etiology would like to make sure we do not have anything acute here.  [id number]        \

## 2014-12-16 ENCOUNTER — Encounter: Payer: Self-pay | Admitting: Internal Medicine

## 2015-02-03 ENCOUNTER — Encounter: Payer: Self-pay | Admitting: Internal Medicine

## 2015-02-03 ENCOUNTER — Non-Acute Institutional Stay (SKILLED_NURSING_FACILITY): Payer: Medicare HMO | Admitting: Internal Medicine

## 2015-02-03 DIAGNOSIS — H00022 Hordeolum internum right lower eyelid: Secondary | ICD-10-CM | POA: Diagnosis not present

## 2015-02-03 DIAGNOSIS — N390 Urinary tract infection, site not specified: Secondary | ICD-10-CM

## 2015-02-03 HISTORY — DX: Hordeolum internum right lower eyelid: H00.022

## 2015-02-03 NOTE — Assessment & Plan Note (Signed)
Garamycin ophth sol 2 ggts QID; warm compresses to area QID; instructed keep hand away from eye

## 2015-02-03 NOTE — Progress Notes (Signed)
MRN: [id number] Name: Sandra Holland  Sex: female Age: 81 y.o. DOB: 02/08/1924  [facility] Facility[facility] Level Of Care: SNF Provider: Merrilee SeashoreALEXANDER, Charlsey Moragne D Emergency Contacts: Extended Emergency Contact Information Primary Emergency Contact: Simmons,Tanya Address: [address]          [place], [place] [id number] [place] Home Phone: [phone] Mobile Phone: [phone] Relation: Grandaughter Secondary Emergency Contact: Lenard GallowaySimmons,Patricia Ann Address: [address]          [place], [place] [address] [place] Home Phone: [phone] Relation: Daughter  Code Status:   Allergies: Hydrocodone and Prednisone  Chief Complaint  Patient presents with  . Acute Visit    HPI: Patient is 81 y.o. female who nursing asked me to see for red eye pt keeps rubbing for 1 day.  Past Medical History  Diagnosis Date  . Dementia   . Hypertension   . Overactive bladder   . DNR (do not resuscitate)     Past Surgical History  Procedure Laterality Date  . Fracture surgery    . Joint replacement        Medication List       This list is accurate as of: 02/03/15  7:09 PM.  Always use your most recent med list.               ABC PLUS SENIOR Tabs  Take 1 tablet by mouth daily.     acetaminophen 325 MG tablet  Commonly known as:  TYLENOL  Take 650 mg by mouth every 6 (six) hours as needed. For pain     busPIRone 5 MG tablet  Commonly known as:  BUSPAR  Take 5 mg by mouth 2 (two) times daily.     calcium-vitamin D 500-200 MG-UNIT per tablet  Commonly known as:  OSCAL WITH D  Take 1 tablet by mouth daily.     escitalopram 10 MG tablet  Commonly known as:  LEXAPRO  Take 15 mg by mouth daily.     food thickener Powd  Commonly known as:  THICK IT  Take 1 Container by mouth as needed (for nectar thick).     furosemide 20 MG tablet  Commonly known as:  LASIX  Take 30 mg by mouth daily.     losartan 50 MG tablet  Commonly known as:   COZAAR  Take 50 mg by mouth daily.     rivastigmine 9.5 mg/24hr  Commonly known as:  EXELON  Place 1 patch (9.5 mg total) onto the skin daily.     senna-docusate 8.6-50 MG per tablet  Commonly known as:  Senokot-S  Take 2 tablets by mouth daily.     Vitamin D-3 5000 UNITS Tabs  Take 1 tablet by mouth once a week.        No orders of the defined types were placed in this encounter.    Immunization History  Administered Date(s) Administered  . Influenza-Unspecified 07/06/2014  . Tdap 04/10/2012    History  Substance Use Topics  . Smoking status: Never Smoker   . Smokeless tobacco: Never Used  . Alcohol Use: No    Review of Systems  DATA OBTAINED: from patient, nurse GENERAL:  no fevers, fatigue, appetite changes SKIN: No itching, rash HEENT: pt admits eyelid does hurt RESPIRATORY: No cough, wheezing, SOB CARDIAC: No chest pain, palpitations, lower extremity edema  GI: No abdominal pain, No N/V/D or constipation, No heartburn or reflux  GU: No dysuria, frequency or urgency,  or incontinence  MUSCULOSKELETAL: No unrelieved bone/joint pain NEUROLOGIC: No headache, dizziness  PSYCHIATRIC: No overt anxiety or sadness  Filed Vitals:   02/03/15 1845  BP: 148/75  Pulse: 70  Temp: 97.1 F (36.2 C)  Resp: 18    Physical Exam  GENERAL APPEARANCE: Alert, conversant, No acute distress  SKIN: No diaphoresis rash HEENT: mild tender swelling gland R inner lower lid, conjuntiva injected RESPIRATORY: Breathing is even, unlabored. Lung sounds are clear   CARDIOVASCULAR: Heart RRR no murmurs, rubs or gallops. No peripheral edema  GASTROINTESTINAL: Abdomen is non distended  GENITOURINARY: Bladder non tender, not distended  MUSCULOSKELETAL: No abnormal joints or musculature NEUROLOGIC: Cranial nerves 2-12 grossly intat PSYCHIATRIC: dementia, no behavioral issues  Patient Active Problem List   Diagnosis Date Noted  . Hordeolum internum of right lower eyelid 02/03/2015   . Osteoporosis 06/10/2014  . Hyperglycemia 03/06/2014  . Dermatitis 03/06/2014  . Chronic venous insufficiency 12/08/2013  . Altered mental status 10/20/2013  . Complicated UTI (urinary tract infection) 05/01/2013  . Edema 04/02/2013  . Dysphagia 04/02/2013  . Depressive disorder, not elsewhere classified 02/25/2013  . Unspecified constipation 02/25/2013  . Essential hypertension, benign 02/25/2013  . Alzheimer's disease 02/25/2013    CBC    Component Value Date/Time   WBC 6.6 06/02/2012 0950   RBC 4.29 06/02/2012 0950   HGB 14.0 06/02/2012 0950   HCT 40.0 06/02/2012 0950   PLT 195 06/02/2012 0950   MCV 93.2 06/02/2012 0950   LYMPHSABS 1.3 06/02/2012 0950   MONOABS 0.9 06/02/2012 0950   EOSABS 0.1 06/02/2012 0950   BASOSABS 0.1 06/02/2012 0950    CMP     Component Value Date/Time   NA 132* 06/02/2012 0950   K 4.2 06/02/2012 0950   CL 94* 06/02/2012 0950   CO2 31 06/02/2012 0950   GLUCOSE 93 06/02/2012 0950   BUN 10 06/02/2012 0950   CREATININE 0.60 06/02/2012 0950   CALCIUM 9.4 06/02/2012 0950   PROT 6.4 06/02/2012 0950   ALBUMIN 3.6 06/02/2012 0950   AST 19 06/02/2012 0950   ALT 7 06/02/2012 0950   ALKPHOS 77 06/02/2012 0950   BILITOT 0.5 06/02/2012 0950   GFRNONAA 80* 06/02/2012 0950   GFRAA >90 06/02/2012 0950    Assessment and Plan  Hordeolum internum of right lower eyelid Garamycin ophth sol 2 ggts QID; warm compresses to area QID; instructed keep hand away from eye     Margit HanksALEXANDER, Dewayne Severe D, MD

## 2015-03-18 ENCOUNTER — Non-Acute Institutional Stay (SKILLED_NURSING_FACILITY): Payer: Medicare HMO | Admitting: Internal Medicine

## 2015-03-18 DIAGNOSIS — M810 Age-related osteoporosis without current pathological fracture: Secondary | ICD-10-CM

## 2015-03-18 DIAGNOSIS — I10 Essential (primary) hypertension: Secondary | ICD-10-CM

## 2015-03-18 DIAGNOSIS — G309 Alzheimer's disease, unspecified: Secondary | ICD-10-CM

## 2015-03-18 DIAGNOSIS — F0280 Dementia in other diseases classified elsewhere without behavioral disturbance: Secondary | ICD-10-CM

## 2015-03-18 DIAGNOSIS — R609 Edema, unspecified: Secondary | ICD-10-CM | POA: Diagnosis not present

## 2015-03-18 NOTE — Progress Notes (Signed)
Patient ID: Sandra Holland, female   DOB: 16-Oct-1923, 81 y.o.   MRN: [id number]     his is a routine visit.  Level of care skilled.  Facility AF .  Complaint-medical management of dementia hypertension depression constipation.   History of present illness.  Patient is a pleasant 81 year old female with the above diagnoses-she appears to be stable-    Her nursing she does appear to have some increased agitation later in the day-she actually was seen by a psychiatric nurse practitioner today she is titrating down her BuSpar and will start her on Depakote.  In regard she other issues she is quite stable recently her  [name]  does not report any recent issues nor does nursing   H  She does have some history of lower extremity edema it appears as on low dose Lasix apparently this has been fairly stable-     She does have some history of nighttime pain ---has Tylenol which has been made routine apparently this gives some relief  She does have a history of UTIs however this apparently has been stable now for some time  She was treated for an eyelid infection recently by Dr. Lyn Hollingshead and this appears to have resolved     .  Family medical social history has been reviewed per previous progress notes --09/22/2014 11/05/2014 .  Medications have been reviewed per [facility] Includes Tylenol 650 mg every 6 hours when necessary.  Os-Cal.  Lasix 30 mg daily.  Centrum Silver.  Lolsartan 50 mg daily.  Exelon patch.  Vitamin D.  Lexapro 15 mg daily.  Tylenol daily at bedtime.  BuSpar 5 mg twice a day- again this is being titrated down with initiation of Depakote: Per psychiatric services .  Review of systems Limited secondary to patient being a poor historian with dementia. --obtained as well from sitter In general no fever chills noted Eyes-treated recently for an eyelid infection on the right this appears resolved.  Respiratory no increased shortness of breath --she does  have an intermittent cough which is not new.  Cardiac she does not complaining of chest pain--appears to have some chronic venous stasis changes --she is followed by wound care GI no complaints of nausea vomiting or abdominal pain--she does have a history of constipation and apparently this has been stable currently on senna.  GU no complaints of dysuria today.  Neurologic she is not complaining of any dizziness or headache Musculoskeletal--does not have joint pain complaints at present  Psych she does have a history of dementia but is quite pleasant and appropriate during exam--apparently later in the day there is increased agitation .  Physical exam.   She is afebrile pulse of 78 respirations 18 blood pressure 105/68 In general this is a pleasant elderly female in no distress  Her skin is warm and dry--her lower legs are wrapped  and not assessed-she is followed by wound care T Her eyes pupils appear equal round reactive to light sclera and conjunctiva are clear.  Oropharynx clear mucous membranes moist.  Chest is clear to auscultation there is no labored breathing.  Heart is regular rate and rhythm with a 1-2/6 systolic murmur she appears to have some chronic venous stasis changes I would say 1-2+ edema lower extremities apparently this is fairly baseline--her legs are wrapped this afternoon.  Abdomen is soft nontender positive bowel sounds.  Muscle skeletal  Moves her extremities at baseline again has significant lower extremity weakness which is not new-ambulates in a wheelchair-has a  sitter for assistance.  Psych she is oriented to self pleasant and appropriate follow simple verbal commands well today     .  Labs.  12/15/2014.  WBC 8.5 hemoglobin 13.4 platelets 194.  Sodium 135 potassium 4.0 BUN 12 creatinine 0.5 a.  Liver function tests within normal limits except albumen of 3.4    11/20/2014.  Vitamin D level was 24.64  11/06/2014.  Sodium 138 potassium 4.1 BUN 16  creatinine 0.6.  CBC 6.0 hemoglobin 13.0 platelets 182.  08/15/2014.  Liver function tests within normal limits except albumin of 3.4.    08/13/2014.  Sodium 137 potassium 4.5 BUN 13 creatinine 0.6.  Albumin 3.4 otherwise liver function tests within normal limits  WBC 7.2 hemoglobin 12.6 platelets 208  04/23/2014.  WBC 6.7 hemoglobin 12.5 platelets 173.  Sodium 135 potassium 4.3 BUN 15 creatinine 0.6.  Liver function tests within normal limits except albumin of 3.2.  03/03/2014.  Fasting glucose 78.    03/03/2014.  Fasting glucose 78.  02/20/2014.  WBC 6.7 hemoglobin 14.5 platelets 251.  Sodium 131 potassium 4.1 BUN 14 creatinine 0.71   01/13/2014.  Urine culture grew greater than 100,000 colonies of Escherichia coli.  Hemoglobin A1c 5.5.  Liver function tests within normal limits except albumin of 3.3.  01/01/2014.  Sodium 135 potassium 4.6 BUN 12 creatinine 0.7.  10/19/2013.  WBC 6.6 hemoglobin 12.2 platelets 202.   Assessment and plan.    #1-dementia-she is on Exelon-has increased agitation later in the day she has been seen by psychiatric services today BuSpar is being titrated down she will be started on Depakote   #2 hypertension--continues on Losartan -e blood pressures appear to be fairly stable   #3-history of venous stasis changes lower extremity she is on Lasix 30 mg a day this appears relatively stable --and is followed by wound care-- we'll update a metabolic panel  #4 osteoporosis this is stable on Os-Cal with vitamin D.-She is on vitamin D supplement--Will update level  #5-history of osteoarthritic pain she is receiving Tylenol apparently with good effect-  -- .  #6-depression that she continues on Lexapro again also is on Exelon for coexistent dementia .  #7  constipation-apparently this has been stable she is on senna  #8-anxiety- on BuSpar at this time this is being titrated down in favor of Depakote  per psychiatric  services  #9 history of UTIs-again this has been stable  recently treated for ESBL early January.  We'll update a CBC CMP as well as vitamin D level to obtain updated values keep an eye on her electrolytes as well as hemoglobin   [id number]        \

## 2015-06-07 ENCOUNTER — Non-Acute Institutional Stay (SKILLED_NURSING_FACILITY): Payer: Medicare HMO | Admitting: Internal Medicine

## 2015-06-07 DIAGNOSIS — G309 Alzheimer's disease, unspecified: Secondary | ICD-10-CM

## 2015-06-07 DIAGNOSIS — N390 Urinary tract infection, site not specified: Secondary | ICD-10-CM | POA: Insufficient documentation

## 2015-06-07 DIAGNOSIS — F0280 Dementia in other diseases classified elsewhere without behavioral disturbance: Secondary | ICD-10-CM

## 2015-06-07 DIAGNOSIS — I10 Essential (primary) hypertension: Secondary | ICD-10-CM | POA: Diagnosis not present

## 2015-06-07 DIAGNOSIS — I872 Venous insufficiency (chronic) (peripheral): Secondary | ICD-10-CM | POA: Diagnosis not present

## 2015-06-07 NOTE — Progress Notes (Signed)
Patient ID: Sandra Holland, female   DOB: [birth year], 81 y.o.   MRN: [id number]      his is a routine visit.  Level of care skilled.  Facility AF .  Complaint-medical management of dementia hypertension depression constipation.   History of present illness.  Patient is a pleasant 81year-old female with the above diagnoses-according to nursing staff and her sitter she has had increased agitation-and confusion-they say this is usually indicative of a UTI which she has with some frequency- Currently she is afebrile with some questionable dysuria this is difficult to fully assess since patient is a poor historian    .  In regard she other issues she is quite stable recently     H  She does have some history of lower extremity edema--she is  on low dose Lasix apparently this has been fairly stable-     She does have some history of nighttime pain ---has Tylenol which has been made routine apparently this gives some relief         .  Family medical social history has been reviewed per previous progress notes -- 11/05/2014--03/18/2015 .  Medications have been reviewed per [facility] Includes Tylenol 650 mg every 6 hours when necessary.  Os-Cal.  Lasix 30 mg daily.  Centrum Silver.  Lolsartan 50 mg daily.  Exelon patch.  Vitamin D.  Lexapro 15 mg daily.  Tylenol daily at bedtime.   Depakote125 mg every morning 250 mg every afternoon .  Review of systems Limited secondary to patient being a poor historian with dementia. --obtained as well from sitter In general no fever chills noted--increased confusion and agitation Skin no rashes or increased itching-left lower leg appears to have developed a small skin tear Eyes-no visual changes.  Respiratory no increased shortness of breath --she does have an intermittent cough which is not new.  Cardiac she does not complaining of chest pain--appears to have some chronic venous stasis changes --she is followed by wound  care GI no complaints of nausea vomiting or abdominal pain--she does have a history of constipation and apparently this has been stable currently on senna.  GU possible dysuria-  Neurologic she is not complaining of any dizziness or headache Musculoskeletal--does not have joint pain complaints at present  Psych she does have a history of dementia---recent  increased agitation .  Physical exam.   Temperature 96.9 axillary pulse 76 respirations 22 blood pressure 137/88 In general this is a pleasant elderly female in no distress  Her skin is warm and dry--her lower legs are wrapped  -she is followed by wound care--I note on her lower left leg above the wrapping there appears to be small skin tear there is some dried blood T Her eyes pupils appear equal round reactive to light sclera and conjunctiva are clear.  Oropharynx clear mucous membranes moist.  Chest is clear to auscultation there is no labored breathing.  Heart is regular rate and rhythm with a 1-2/6 systolic murmur she appears to have some chronic venous stasis changes I would say 1-2+ edema lower extremities apparently this is fairly baseline--her legs are wrapped this afternoon.  Abdomen is soft nontender positive bowel sounds. GU appears to have some grimacing with palpation more of her suprapubic area she is a poor historian so this is difficult to fully assess  Muscle skeletal  Moves her extremities at baseline again has significant lower extremity weakness which is not new-ambulates in a wheelchair-has a sitter for assistance.  Psych she is oriented  to self--is actually pleasant with me today but apparently has increased agitation recently-she does follow simple verbal commands with prompting     .  Labs.  03/23/2015.  Vitamin D level XXXII.5  12/15/2014.  WBC 8.5 hemoglobin 13.4 platelets 194.  Sodium 135 potassium 4.0 BUN 12 creatinine 0.5 a.  Liver function tests within normal limits except albumen of 3.4     11/20/2014.  Vitamin D level was 24.64  11/06/2014.  Sodium 138 potassium 4.1 BUN 16 creatinine 0.6.  CBC 6.0 hemoglobin 13.0 platelets 182.  08/15/2014.  Liver function tests within normal limits except albumin of 3.4.    08/13/2014.  Sodium 137 potassium 4.5 BUN 13 creatinine 0.6.  Albumin 3.4 otherwise liver function tests within normal limits  WBC 7.2 hemoglobin 12.6 platelets 208  04/23/2014.  WBC 6.7 hemoglobin 12.5 platelets 173.  Sodium 135 potassium 4.3 BUN 15 creatinine 0.6.  Liver function tests within normal limits except albumin of 3.2.  03/03/2014.  Fasting glucose 78.    03/03/2014.  Fasting glucose 78.  02/20/2014.  WBC 6.7 hemoglobin 14.5 platelets 251.  Sodium 131 potassium 4.1 BUN 14 creatinine 0.71   01/13/2014.  Urine culture grew greater than 100,000 colonies of Escherichia coli.  Hemoglobin A1c 5.5.  Liver function tests within normal limits except albumin of 3.3.  01/01/2014.  Sodium 135 potassium 4.6 BUN 12 creatinine 0.7.  10/19/2013.  WBC 6.6 hemoglobin 12.2 platelets 202.   Assessment and plan.    #1-dementia-she is on Exelon- Also psychiatric services has started her on Depakote twice a day-apparently there's been increased agitation recently indicative per nursing staff  And sitter  of a UTI usually- Urine culture has returned showing positive nitrites and large leukocytes many bacteria elevated white blood cells-will start Cipro 250 mg twice a day empirically for 7 days and await final culture results   #2 hypertension--continues on Losartan -recent blood pressures appear to be fairly stable   #3-history of venous stasis changes lower extremity she is on Lasix 30 mg a day this appears relatively stable --and is followed by wound care-- we'll update a metabolic panel  does appear to have a skin tear lower left leg do not see sign of cellulitis againwound care will be notified  #4 osteoporosis this is stable on Os-Cal  with vitamin D.-She is on vitamin D supplement-  #5-history of osteoarthritic pain she is receiving Tylenol apparently with good effect-  -- .  #6-depression that she continues on Lexapro again also is on Exelon for coexistent dementia .  #7  constipation-apparently this has been stable she is on senna     #8 history of UTIs-as noted above urinalysis is suspicious for UTI along with increased agitation what appears to be some suprapubic tenderness  We'll update a CBC CMP as well as vitamin D level to obtain updated values keep an eye on her electrolytes as well as hemoglobin   [id number]        \

## 2015-06-29 DIAGNOSIS — M79671 Pain in right foot: Secondary | ICD-10-CM | POA: Diagnosis not present

## 2015-06-29 DIAGNOSIS — M79672 Pain in left foot: Secondary | ICD-10-CM | POA: Diagnosis not present

## 2015-06-29 DIAGNOSIS — B351 Tinea unguium: Secondary | ICD-10-CM | POA: Diagnosis not present

## 2015-06-29 DIAGNOSIS — E119 Type 2 diabetes mellitus without complications: Secondary | ICD-10-CM | POA: Diagnosis not present

## 2015-09-14 DIAGNOSIS — I739 Peripheral vascular disease, unspecified: Secondary | ICD-10-CM | POA: Diagnosis not present

## 2015-09-14 DIAGNOSIS — B351 Tinea unguium: Secondary | ICD-10-CM | POA: Diagnosis not present

## 2015-09-14 DIAGNOSIS — E119 Type 2 diabetes mellitus without complications: Secondary | ICD-10-CM | POA: Diagnosis not present

## 2015-09-14 DIAGNOSIS — M79671 Pain in right foot: Secondary | ICD-10-CM | POA: Diagnosis not present

## 2015-10-04 ENCOUNTER — Non-Acute Institutional Stay (SKILLED_NURSING_FACILITY): Payer: Medicare HMO | Admitting: Internal Medicine

## 2015-10-04 ENCOUNTER — Encounter: Payer: Self-pay | Admitting: Internal Medicine

## 2015-10-04 DIAGNOSIS — I10 Essential (primary) hypertension: Secondary | ICD-10-CM

## 2015-10-04 DIAGNOSIS — G308 Other Alzheimer's disease: Secondary | ICD-10-CM

## 2015-10-04 DIAGNOSIS — H6122 Impacted cerumen, left ear: Secondary | ICD-10-CM | POA: Diagnosis not present

## 2015-10-04 DIAGNOSIS — K5900 Constipation, unspecified: Secondary | ICD-10-CM

## 2015-10-04 DIAGNOSIS — G309 Alzheimer's disease, unspecified: Principal | ICD-10-CM

## 2015-10-04 DIAGNOSIS — F0281 Dementia in other diseases classified elsewhere with behavioral disturbance: Secondary | ICD-10-CM

## 2015-10-04 DIAGNOSIS — R69 Illness, unspecified: Secondary | ICD-10-CM | POA: Diagnosis not present

## 2015-10-04 NOTE — Progress Notes (Signed)
Patient ID: Sandra PascalBernadine S Grubb, female   DOB: 12/22/1923, 82 y.o.   MRN: [id number]      his is a routine visit.  Level of care skilled.  Facility [facility] .  Complaint-medical management of dementia hypertension depression constipation-- Acute visit secondary to possible wax impaction left ear- irritated.eyes   History of present illness.  Patient is a pleasant 82year-old female with the above diagnoses-she does have a history of dementia and is followed by the psychiatric nurse practitioner-Depakote recently was changed to 125 mg twice a day-she is also on an Exelon patch as well as Lexapro 15 mg a day and on Ativan every 6 hours when necessary.  Apparently her behaviors have stabilized per her sitter who is with her today.  Her sitter does state that patient seems to have some decreased hearing in her left ear and apparently has seen an ENT previously for wax impaction.  Patient also complains at times apparently of irritated eyes and although there do not appear to be visual changes per her sitter  She also has a history of constipation and is currently on senna-as well as MiraLAX per her sitter-she feels occasionally she still has some hard stools and would benefit from another stool softener-she has received milk of magnesia but her sitter states that this almost makes her go too much     H  She does have some history of lower extremity edema--she is  on low dose Lasix apparently this has been fairly stable-     She does have some history of nighttime pain ---has Tylenol which has been made routine apparently this gives some relief         .  Family medical social history has been reviewed per previous progress notes --including 11/05/2014--03/18/2015--06/07/2015 .  [facility] Includes Tylenol 650 mg every 6 hours when necessary.  Os-Cal.  Lasix 30 mg daily.  Centrum Silver.  Lolsartan 50 mg daily.  Exelon patch.  Vitamin  D.  Lexapro 15 mg daily.  Tylenol daily at bedtime.   Depakote125 mg BID  Ativan 0.25 mg every 6 hours when necessary.  Senna.  MiraLAX daily.   .  Review of systems Limited secondary to patient being a poor historian with dementia. --obtained as well from sitter In general no fever chills noted--behaviors and agitation have improved per sitter Skin no rashes or increased itching-left lower leg appears to have developed a small skin tear Eyes-no visual changes--complaints apparently of some irritated eyes bilaterally at times.  Respiratory no increased shortness of breath --she does have an intermittent cough which is not new.  Cardiac she does not complaining of chest pain--appears to have some chronic venous stasis changes --she is followed by wound care GI no complaints of nausea vomiting or abdominal pain--she does have a history of constipation  GU possible dysuria-  Neurologic she is not complaining of any dizziness or headache Musculoskeletal--does not have joint pain complaints at present  Psych she does have a history of dementia--- but apparently this has stabilized recently .  Physical exam.   She is afebrile pulse 80 respirations 17 blood pressure 140/80 taken manually apparently all these are relatively baseline per sitter blood pressures range in the 130-140 range systolically most of the time In general this is a pleasant elderly female in no distress  Her skin is warm and dry--her lower legs are wrapped  -she is followed by wound care-  T Her eyes pupils appear equal  round reactive to light sclera and conjunctiva are clear Ears-doest appear to have a moderate amount of wax in her left ear right ear appears clear--TMs are visualized.  Oropharynx clear mucous membranes moist.  Chest is clear to auscultation there is no labored breathing.  Heart is regular rate and rhythm with a 1-2/6 systolic murmur she appears to have some chronic venous stasis changes I would  say 1-2+ edema lower extremities apparently this is fairly baseline--her legs are wrapped this evening  Abdomen is soft nontender positive bowel sounds.   Muscle skeletal  Moves her extremities at baseline again has significant lower extremity weakness which is not new-ambulates in a wheelchair-has a sitter for assistance.  Psych she is oriented to self-- Is cooperative with exam but needs prompting by sitter often     .  Labs.  06/08/2015.  Sodium 137 potassium 3.7 BUN 21 creatinine 0.8.  Liver function tests within normal limits except albumin of 3.1.  WBC 8.7 hemoglobin 13.3 platelets 232  03/23/2015.  Vitamin D level XXXII.5  12/15/2014.  WBC 8.5 hemoglobin 13.4 platelets 194.  Sodium 135 potassium 4.0 BUN 12 creatinine 0.5 a.  Liver function tests within normal limits except albumen of 3.4    11/20/2014.  Vitamin D level was 24.64  11/06/2014.  Sodium 138 potassium 4.1 BUN 16 creatinine 0.6.  CBC 6.0 hemoglobin 13.0 platelets 182.  08/15/2014.  Liver function tests within normal limits except albumin of 3.4.    08/13/2014.  Sodium 137 potassium 4.5 BUN 13 creatinine 0.6.  Albumin 3.4 otherwise liver function tests within normal limits  WBC 7.2 hemoglobin 12.6 platelets 208  04/23/2014.  WBC 6.7 hemoglobin 12.5 platelets 173.  Sodium 135 potassium 4.3 BUN 15 creatinine 0.6.  Liver function tests within normal limits except albumin of 3.2.  03/03/2014.  Fasting glucose 78.    03/03/2014.  Fasting glucose 78.  02/20/2014.  WBC 6.7 hemoglobin 14.5 platelets 251.  Sodium 131 potassium 4.1 BUN 14 creatinine 0.71   01/13/2014.  Urine culture grew greater than 100,000 colonies of Escherichia coli.  Hemoglobin A1c 5.5.  Liver function tests within normal limits except albumin of 3.3.  01/01/2014.  Sodium 135 potassium 4.6 BUN 12 creatinine 0.7.  10/19/2013.  WBC 6.6 hemoglobin 12.2 platelets 202.   Assessment and plan.     #1-dementia-she is on Exelon- She is also on Depakote 125 mg twice a day-apparently behaviors have stabilized which is encouraging--will update liver function tests and Depakote level    #2 hypertension--continues on Losartan -recent blood pressures appear to be fairly stable as noted above   #3-history of venous stasis changes lower extremity she is on Lasix 30 mg a day this appears relatively stable --and is followed by wound care-- we'll update a metabolic panel    #4 osteoporosis this is stable on Os-Cal with vitamin D.-She is on vitamin D supplement-  #5-history of osteoarthritic pain she is receiving Tylenol apparently with good effect-  -- .  #6-depression that she continues on Lexapro again also is on Exelon for coexistent dementia .  #7  constipation per her sitter this appears still to be somewhat of an issue she is receiving senna also apparently MiraLAX will add Colace 100 twice a day this will have to be monitored however     #8 history of UTIs- She has had this at times with increased behaviors but behaviors have stabilized at this point will monitor  #9 conjunctivitis? Eye irritation- I could not really see evidence  of acute conjunctivitis on exam we'll treat for suspected dry eyes with artificial tears when necessary and monitor  #10-wax impaction Will treat left ear with antibiotics twice a day for 10 days and flush with warm water on day 4 and monitor.  Again will update labs including a CMP to check electrolytes-as well as liver function-also a Depakote level-and CBC with differential for updated values.  [id number] note greater than 35 minutes spent assessing patient-reviewing her chart-discussing her status with sitter at bedside-and coordinating and formulating a plan of care for numerous diagnoses-of note greater than 50% of time spent coordinating plan of care           \

## 2015-10-05 DIAGNOSIS — N182 Chronic kidney disease, stage 2 (mild): Secondary | ICD-10-CM | POA: Diagnosis not present

## 2015-10-05 DIAGNOSIS — E119 Type 2 diabetes mellitus without complications: Secondary | ICD-10-CM | POA: Diagnosis not present

## 2015-10-05 DIAGNOSIS — E785 Hyperlipidemia, unspecified: Secondary | ICD-10-CM | POA: Diagnosis not present

## 2015-10-05 DIAGNOSIS — I129 Hypertensive chronic kidney disease with stage 1 through stage 4 chronic kidney disease, or unspecified chronic kidney disease: Secondary | ICD-10-CM | POA: Diagnosis not present

## 2015-10-05 LAB — BASIC METABOLIC PANEL
BUN: 21 mg/dL (ref 4–21)
CREATININE: 0.7 mg/dL (ref 0.5–1.1)
GLUCOSE: 78 mg/dL
Potassium: 4.2 mmol/L (ref 3.4–5.3)
Sodium: 139 mmol/L (ref 137–147)

## 2015-10-05 LAB — CBC AND DIFFERENTIAL
HCT: 37 % (ref 36–46)
Hemoglobin: 12.5 g/dL (ref 12.0–16.0)
Platelets: 187 K/µL (ref 150–399)
WBC: 5.9 10*3/mL

## 2015-10-05 LAB — HEPATIC FUNCTION PANEL
ALT: 10 U/L (ref 7–35)
AST: 19 U/L (ref 13–35)
Alkaline Phosphatase: 51 U/L (ref 25–125)
Bilirubin, Total: 0.5 mg/dL

## 2015-11-05 ENCOUNTER — Non-Acute Institutional Stay (SKILLED_NURSING_FACILITY): Payer: Medicare HMO | Admitting: Internal Medicine

## 2015-11-05 ENCOUNTER — Encounter: Payer: Self-pay | Admitting: Internal Medicine

## 2015-11-05 DIAGNOSIS — F329 Major depressive disorder, single episode, unspecified: Secondary | ICD-10-CM

## 2015-11-05 DIAGNOSIS — G308 Other Alzheimer's disease: Secondary | ICD-10-CM | POA: Diagnosis not present

## 2015-11-05 DIAGNOSIS — I10 Essential (primary) hypertension: Secondary | ICD-10-CM | POA: Diagnosis not present

## 2015-11-05 DIAGNOSIS — F0281 Dementia in other diseases classified elsewhere with behavioral disturbance: Secondary | ICD-10-CM | POA: Diagnosis not present

## 2015-11-05 DIAGNOSIS — F32A Depression, unspecified: Secondary | ICD-10-CM

## 2015-11-05 DIAGNOSIS — R69 Illness, unspecified: Secondary | ICD-10-CM | POA: Diagnosis not present

## 2015-11-05 DIAGNOSIS — G309 Alzheimer's disease, unspecified: Secondary | ICD-10-CM

## 2015-11-05 NOTE — Progress Notes (Signed)
MRN: [id number] Name: Sandra Holland  Sex: female Age: 82 y.o. DOB: 1924/05/08  [facility] #: [facility] Facility/Room:319 Level Of Care: SNF Provider: Merrilee Seashore D Emergency Contacts: Extended Emergency Contact Information Primary Emergency Contact: Simmons,Tanya Address: [address]          [place], [place] [address] [place] Home Phone: [phone] Mobile Phone: [phone] Relation: Grandaughter Secondary Emergency Contact: Lenard Galloway Address: [address]          [place], [place] [address] [place] Home Phone: [phone] Relation: Daughter  Code Status:   Allergies: Hydrocodone and Prednisone  Chief Complaint  Patient presents with  . Medical Management of Chronic Issues    HPI: Patient is 82 y.o. female with dementia, HTN over active bladder who is being seen for routine issues of HTN, Alzheimers dx and depression.  Past Medical History  Diagnosis Date  . Dementia   . Hypertension   . Overactive bladder   . DNR (do not resuscitate)     Past Surgical History  Procedure Laterality Date  . Fracture surgery    . Joint replacement        Medication List       This list is accurate as of: 11/05/15 11:59 PM.  Always use your most recent med list.               ABC PLUS SENIOR Tabs  Take 1 tablet by mouth daily.     acetaminophen 325 MG tablet  Commonly known as:  TYLENOL  Take 650 mg by mouth every 6 (six) hours as needed. For pain     busPIRone 5 MG tablet  Commonly known as:  BUSPAR  Take 10 mg by mouth 2 (two) times daily.     calcium-vitamin D 500-200 MG-UNIT tablet  Commonly known as:  OSCAL WITH D  Take 1 tablet by mouth daily.     divalproex 125 MG DR tablet  Commonly known as:  DEPAKOTE  Take 125 mg by mouth 2 (two) times daily.     escitalopram 10 MG tablet  Commonly known as:  LEXAPRO  Take 15 mg by mouth daily.     food thickener Powd  Commonly known as:  THICK IT  Take 1  Container by mouth as needed (for nectar thick).     furosemide 20 MG tablet  Commonly known as:  LASIX  Take 30 mg by mouth daily.     LORazepam 0.5 MG tablet  Commonly known as:  ATIVAN  Take 0.25 mg by mouth every 6 (six) hours as needed for anxiety.     losartan 50 MG tablet  Commonly known as:  COZAAR  Take 50 mg by mouth daily.     rivastigmine 9.5 mg/24hr  Commonly known as:  EXELON  Place 1 patch (9.5 mg total) onto the skin daily.     senna-docusate 8.6-50 MG tablet  Commonly known as:  Senokot-S  Take 2 tablets by mouth daily.     Vitamin D-3 5000 UNITS Tabs  Take 1 tablet by mouth once a week.        No orders of the defined types were placed in this encounter.    Immunization History  Administered Date(s) Administered  . Influenza-Unspecified 07/06/2014  . Tdap 04/10/2012    Social History  Substance Use Topics  . Smoking status: Never Smoker   . Smokeless tobacco: Never Used  . Alcohol Use: No    Review  of Systems  UTO 2/2 dementia; nursing without concerns    Filed Vitals:   11/05/15 1534  BP: 111/71  Pulse: 77  Temp: 98.8 F (37.1 C)  Resp: 18    Physical Exam  GENERAL APPEARANCE: Alert, min conversant, No acute distress  SKIN: No diaphoresis rash HEENT: Unremarkable RESPIRATORY: Breathing is even, unlabored. Lung sounds are clear   CARDIOVASCULAR: Heart RRR no murmurs, rubs or gallops. No peripheral edema  GASTROINTESTINAL: Abdomen is soft, non-tender, not distended w/ normal bowel sounds.  GENITOURINARY: Bladder non tender, not distended  MUSCULOSKELETAL: No abnormal joints or musculature NEUROLOGIC: Cranial nerves 2-12 grossly intact. Moves all extremities PSYCHIATRIC: dementia, no behavioral issues  Patient Active Problem List   Diagnosis Date Noted  . UTI (urinary tract infection) 06/07/2015  . Hordeolum internum of right lower eyelid 02/03/2015  . Osteoporosis 06/10/2014  . Hyperglycemia 03/06/2014  . Dermatitis  03/06/2014  . Chronic venous insufficiency 12/08/2013  . Altered mental status 10/20/2013  . Complicated UTI (urinary tract infection) 05/01/2013  . Edema 04/02/2013  . Dysphagia 04/02/2013  . Depression 02/25/2013  . Constipation 02/25/2013  . Essential hypertension, benign 02/25/2013  . Alzheimer's disease 02/25/2013    CBC    Component Value Date/Time   WBC 6.6 06/02/2012 0950   RBC 4.29 06/02/2012 0950   HGB 14.0 06/02/2012 0950   HCT 40.0 06/02/2012 0950   PLT 195 06/02/2012 0950   MCV 93.2 06/02/2012 0950   LYMPHSABS 1.3 06/02/2012 0950   MONOABS 0.9 06/02/2012 0950   EOSABS 0.1 06/02/2012 0950   BASOSABS 0.1 06/02/2012 0950    CMP     Component Value Date/Time   NA 132* 06/02/2012 0950   K 4.2 06/02/2012 0950   CL 94* 06/02/2012 0950   CO2 31 06/02/2012 0950   GLUCOSE 93 06/02/2012 0950   BUN 10 06/02/2012 0950   CREATININE 0.60 06/02/2012 0950   CALCIUM 9.4 06/02/2012 0950   PROT 6.4 06/02/2012 0950   ALBUMIN 3.6 06/02/2012 0950   AST 19 06/02/2012 0950   ALT 7 06/02/2012 0950   ALKPHOS 77 06/02/2012 0950   BILITOT 0.5 06/02/2012 0950   GFRNONAA 80* 06/02/2012 0950   GFRAA >90 06/02/2012 0950    Assessment and Plan  Essential hypertension, benign Well controlled on Losartan 50 mg daily and lasix 20 mg daily; plan - cont current meds  Alzheimer's disease Chronic, slow trajectory; pt has sitter with her 24/7; plan cont exelon  Depression Mood appears stable;plan - cont lexapro    Margit Hanks, MD

## 2015-11-06 ENCOUNTER — Encounter: Payer: Self-pay | Admitting: Internal Medicine

## 2015-11-06 NOTE — Assessment & Plan Note (Signed)
Well controlled on Losartan 50 mg daily and lasix 20 mg daily; plan - cont current meds

## 2015-11-06 NOTE — Assessment & Plan Note (Signed)
Mood appears stable;plan - cont lexapro

## 2015-11-06 NOTE — Assessment & Plan Note (Signed)
Chronic, slow trajectory; pt has sitter with her 24/7; plan cont exelon

## 2015-11-13 DIAGNOSIS — I129 Hypertensive chronic kidney disease with stage 1 through stage 4 chronic kidney disease, or unspecified chronic kidney disease: Secondary | ICD-10-CM | POA: Diagnosis not present

## 2015-11-13 DIAGNOSIS — N182 Chronic kidney disease, stage 2 (mild): Secondary | ICD-10-CM | POA: Diagnosis not present

## 2015-11-13 DIAGNOSIS — Z993 Dependence on wheelchair: Secondary | ICD-10-CM | POA: Diagnosis not present

## 2015-11-14 DIAGNOSIS — N182 Chronic kidney disease, stage 2 (mild): Secondary | ICD-10-CM | POA: Diagnosis not present

## 2015-11-14 DIAGNOSIS — I129 Hypertensive chronic kidney disease with stage 1 through stage 4 chronic kidney disease, or unspecified chronic kidney disease: Secondary | ICD-10-CM | POA: Diagnosis not present

## 2015-11-14 DIAGNOSIS — Z993 Dependence on wheelchair: Secondary | ICD-10-CM | POA: Diagnosis not present

## 2015-11-15 DIAGNOSIS — N182 Chronic kidney disease, stage 2 (mild): Secondary | ICD-10-CM | POA: Diagnosis not present

## 2015-11-15 DIAGNOSIS — Z993 Dependence on wheelchair: Secondary | ICD-10-CM | POA: Diagnosis not present

## 2015-11-15 DIAGNOSIS — I129 Hypertensive chronic kidney disease with stage 1 through stage 4 chronic kidney disease, or unspecified chronic kidney disease: Secondary | ICD-10-CM | POA: Diagnosis not present

## 2015-11-16 DIAGNOSIS — N182 Chronic kidney disease, stage 2 (mild): Secondary | ICD-10-CM | POA: Diagnosis not present

## 2015-11-16 DIAGNOSIS — Z961 Presence of intraocular lens: Secondary | ICD-10-CM | POA: Diagnosis not present

## 2015-11-16 DIAGNOSIS — I129 Hypertensive chronic kidney disease with stage 1 through stage 4 chronic kidney disease, or unspecified chronic kidney disease: Secondary | ICD-10-CM | POA: Diagnosis not present

## 2015-11-16 DIAGNOSIS — Z993 Dependence on wheelchair: Secondary | ICD-10-CM | POA: Diagnosis not present

## 2015-11-16 DIAGNOSIS — H04123 Dry eye syndrome of bilateral lacrimal glands: Secondary | ICD-10-CM | POA: Diagnosis not present

## 2015-11-17 DIAGNOSIS — I129 Hypertensive chronic kidney disease with stage 1 through stage 4 chronic kidney disease, or unspecified chronic kidney disease: Secondary | ICD-10-CM | POA: Diagnosis not present

## 2015-11-17 DIAGNOSIS — N182 Chronic kidney disease, stage 2 (mild): Secondary | ICD-10-CM | POA: Diagnosis not present

## 2015-11-17 DIAGNOSIS — Z993 Dependence on wheelchair: Secondary | ICD-10-CM | POA: Diagnosis not present

## 2015-11-18 DIAGNOSIS — N182 Chronic kidney disease, stage 2 (mild): Secondary | ICD-10-CM | POA: Diagnosis not present

## 2015-11-18 DIAGNOSIS — Z993 Dependence on wheelchair: Secondary | ICD-10-CM | POA: Diagnosis not present

## 2015-11-18 DIAGNOSIS — I129 Hypertensive chronic kidney disease with stage 1 through stage 4 chronic kidney disease, or unspecified chronic kidney disease: Secondary | ICD-10-CM | POA: Diagnosis not present

## 2015-11-19 DIAGNOSIS — Z993 Dependence on wheelchair: Secondary | ICD-10-CM | POA: Diagnosis not present

## 2015-11-19 DIAGNOSIS — N182 Chronic kidney disease, stage 2 (mild): Secondary | ICD-10-CM | POA: Diagnosis not present

## 2015-11-19 DIAGNOSIS — I129 Hypertensive chronic kidney disease with stage 1 through stage 4 chronic kidney disease, or unspecified chronic kidney disease: Secondary | ICD-10-CM | POA: Diagnosis not present

## 2015-11-20 DIAGNOSIS — I129 Hypertensive chronic kidney disease with stage 1 through stage 4 chronic kidney disease, or unspecified chronic kidney disease: Secondary | ICD-10-CM | POA: Diagnosis not present

## 2015-11-20 DIAGNOSIS — Z993 Dependence on wheelchair: Secondary | ICD-10-CM | POA: Diagnosis not present

## 2015-11-20 DIAGNOSIS — N182 Chronic kidney disease, stage 2 (mild): Secondary | ICD-10-CM | POA: Diagnosis not present

## 2015-11-21 DIAGNOSIS — Z993 Dependence on wheelchair: Secondary | ICD-10-CM | POA: Diagnosis not present

## 2015-11-21 DIAGNOSIS — I129 Hypertensive chronic kidney disease with stage 1 through stage 4 chronic kidney disease, or unspecified chronic kidney disease: Secondary | ICD-10-CM | POA: Diagnosis not present

## 2015-11-21 DIAGNOSIS — N182 Chronic kidney disease, stage 2 (mild): Secondary | ICD-10-CM | POA: Diagnosis not present

## 2015-11-22 DIAGNOSIS — N182 Chronic kidney disease, stage 2 (mild): Secondary | ICD-10-CM | POA: Diagnosis not present

## 2015-11-22 DIAGNOSIS — Z993 Dependence on wheelchair: Secondary | ICD-10-CM | POA: Diagnosis not present

## 2015-11-22 DIAGNOSIS — I129 Hypertensive chronic kidney disease with stage 1 through stage 4 chronic kidney disease, or unspecified chronic kidney disease: Secondary | ICD-10-CM | POA: Diagnosis not present

## 2015-11-23 DIAGNOSIS — I129 Hypertensive chronic kidney disease with stage 1 through stage 4 chronic kidney disease, or unspecified chronic kidney disease: Secondary | ICD-10-CM | POA: Diagnosis not present

## 2015-11-23 DIAGNOSIS — Z993 Dependence on wheelchair: Secondary | ICD-10-CM | POA: Diagnosis not present

## 2015-11-23 DIAGNOSIS — N182 Chronic kidney disease, stage 2 (mild): Secondary | ICD-10-CM | POA: Diagnosis not present

## 2015-11-24 DIAGNOSIS — N182 Chronic kidney disease, stage 2 (mild): Secondary | ICD-10-CM | POA: Diagnosis not present

## 2015-11-24 DIAGNOSIS — Z993 Dependence on wheelchair: Secondary | ICD-10-CM | POA: Diagnosis not present

## 2015-11-24 DIAGNOSIS — I129 Hypertensive chronic kidney disease with stage 1 through stage 4 chronic kidney disease, or unspecified chronic kidney disease: Secondary | ICD-10-CM | POA: Diagnosis not present

## 2015-11-25 DIAGNOSIS — I129 Hypertensive chronic kidney disease with stage 1 through stage 4 chronic kidney disease, or unspecified chronic kidney disease: Secondary | ICD-10-CM | POA: Diagnosis not present

## 2015-11-25 DIAGNOSIS — N182 Chronic kidney disease, stage 2 (mild): Secondary | ICD-10-CM | POA: Diagnosis not present

## 2015-11-25 DIAGNOSIS — Z993 Dependence on wheelchair: Secondary | ICD-10-CM | POA: Diagnosis not present

## 2015-11-26 DIAGNOSIS — N182 Chronic kidney disease, stage 2 (mild): Secondary | ICD-10-CM | POA: Diagnosis not present

## 2015-11-26 DIAGNOSIS — I129 Hypertensive chronic kidney disease with stage 1 through stage 4 chronic kidney disease, or unspecified chronic kidney disease: Secondary | ICD-10-CM | POA: Diagnosis not present

## 2015-11-26 DIAGNOSIS — Z993 Dependence on wheelchair: Secondary | ICD-10-CM | POA: Diagnosis not present

## 2015-11-29 DIAGNOSIS — Z993 Dependence on wheelchair: Secondary | ICD-10-CM | POA: Diagnosis not present

## 2015-11-29 DIAGNOSIS — N182 Chronic kidney disease, stage 2 (mild): Secondary | ICD-10-CM | POA: Diagnosis not present

## 2015-11-29 DIAGNOSIS — I129 Hypertensive chronic kidney disease with stage 1 through stage 4 chronic kidney disease, or unspecified chronic kidney disease: Secondary | ICD-10-CM | POA: Diagnosis not present

## 2015-11-30 DIAGNOSIS — N182 Chronic kidney disease, stage 2 (mild): Secondary | ICD-10-CM | POA: Diagnosis not present

## 2015-11-30 DIAGNOSIS — Z993 Dependence on wheelchair: Secondary | ICD-10-CM | POA: Diagnosis not present

## 2015-11-30 DIAGNOSIS — I129 Hypertensive chronic kidney disease with stage 1 through stage 4 chronic kidney disease, or unspecified chronic kidney disease: Secondary | ICD-10-CM | POA: Diagnosis not present

## 2015-12-08 DIAGNOSIS — M79671 Pain in right foot: Secondary | ICD-10-CM | POA: Diagnosis not present

## 2015-12-08 DIAGNOSIS — E119 Type 2 diabetes mellitus without complications: Secondary | ICD-10-CM | POA: Diagnosis not present

## 2015-12-08 DIAGNOSIS — I739 Peripheral vascular disease, unspecified: Secondary | ICD-10-CM | POA: Diagnosis not present

## 2015-12-08 DIAGNOSIS — B351 Tinea unguium: Secondary | ICD-10-CM | POA: Diagnosis not present

## 2015-12-16 DIAGNOSIS — I129 Hypertensive chronic kidney disease with stage 1 through stage 4 chronic kidney disease, or unspecified chronic kidney disease: Secondary | ICD-10-CM | POA: Diagnosis not present

## 2015-12-20 ENCOUNTER — Non-Acute Institutional Stay (SKILLED_NURSING_FACILITY): Payer: Medicare HMO | Admitting: Internal Medicine

## 2015-12-20 ENCOUNTER — Encounter: Payer: Self-pay | Admitting: Internal Medicine

## 2015-12-20 DIAGNOSIS — M810 Age-related osteoporosis without current pathological fracture: Secondary | ICD-10-CM

## 2015-12-20 DIAGNOSIS — R69 Illness, unspecified: Secondary | ICD-10-CM | POA: Diagnosis not present

## 2015-12-20 DIAGNOSIS — T148 Other injury of unspecified body region: Secondary | ICD-10-CM

## 2015-12-20 DIAGNOSIS — G309 Alzheimer's disease, unspecified: Secondary | ICD-10-CM

## 2015-12-20 DIAGNOSIS — F0281 Dementia in other diseases classified elsewhere with behavioral disturbance: Secondary | ICD-10-CM

## 2015-12-20 DIAGNOSIS — I10 Essential (primary) hypertension: Secondary | ICD-10-CM | POA: Diagnosis not present

## 2015-12-20 DIAGNOSIS — G308 Other Alzheimer's disease: Secondary | ICD-10-CM | POA: Diagnosis not present

## 2015-12-20 DIAGNOSIS — N10 Acute tubulo-interstitial nephritis: Secondary | ICD-10-CM | POA: Diagnosis not present

## 2015-12-20 DIAGNOSIS — T148XXA Other injury of unspecified body region, initial encounter: Secondary | ICD-10-CM

## 2015-12-20 NOTE — Progress Notes (Signed)
Patient ID: Sandra Holland, female   DOB: April 18, 1924, 82 y.o.   MRN: [id number]       This  is a routine visit.  Level of care skilled.  Facility [facility] .  Complaint-medical management of dementia hypertension depression constipation--acute visit secondary to UTI  History of present illness.  Patient is a pleasant 82year-old female with the above diagnoses-she does have a history of dementia and is followed by the psychiatric nurse practitioner-she is on Depakote 125 mg daily at bedtime-she is also on an Exelon patch as well as Lexapro 15 mg a day and on Ativan every 6 hours when necessary.  Apparently her behaviors have stabilized per her sitter who is with her today.Marland Kitchen  She was noted to have some increased agitation recently which she tends to have when she has a UTI and it has grown out Escherichia coli actually positive for ESBL     She also has a history of constipation and is currently on senna- Colace was recently added as well as secondary to continued constipation apparently this has helped.       H  She does have some history of lower extremity edema--she is  on low dose Lasix apparently this has been fairly stable-     She does have some history of nighttime pain ---has Tylenol which has been made routine apparently this gives some relief         .  Family medical social history has been reviewed per previous progress notes --including 11/05/2014--03/18/2015--06/07/2015 .  Medications have been reviewed per [facility] Includes Tylenol 650 mg every 6 hours when necessary.  Os-Cal.  Lasix 30 mg daily.  Centrum Silver.  Lolsartan 50 mg daily.  Exelon patch.  Vitamin D.  Lexapro 15 mg daily.  Tylenol daily at bedtime.   Depakote125 mg QHS  Ativan 0.25 mg every 6 hours when necessary.  Senna.  MiraLAX daily.   .  Review of systems Limited secondary to patient being a poor historian with dementia. --obtained as well from sitter In  general no fever chills noted--recently increased agitation prompted urine culture which has grown out Escherichia coli ESBL Skin no rashes or increased itching- Eyes-no visual changes--.  Respiratory no increased shortness of breath --she does have an intermittent cough which is not new.  Cardiac she does not complaining of chest pain--appears to have some chronic venous stasis changes --she is followed by wound care GI no complaints of nausea vomiting or abdominal pain--she does have a history of constipation but is not complaining of that today sitter has not noted this either GU possible dysuria-  Neurologic she is not complaining of any dizziness or headache Musculoskeletal--does not have joint pain complaints at present  Psych she does have a history of dementia---  .  Physical exam.   Temperature 98.6 pulse 75 respirations 18 blood pressure 119/76-127/80 most recently In general this is a pleasant elderly female in no distress  Her skin is warm and dry--her lower legs are wrapped  -she is followed by wound care- She does have a violaceous bruise somewhat brawny appearing now appears to be healing on the right fore head status post fall apparently with no other apparent injury  T Her eyes pupils appear equal round reactive to light sclera and conjunctiva are clear    Oropharynx clear mucous membranes moist.  Chest is clear to auscultation there is no labored breathing.  Heart is regular rate and rhythm with a 1-2/6 systolic murmur she  appears to have some chronic venous stasis changes I would say 1-2+ edema lower extremities apparently this is fairly baseline--her legs are wrapped  Abdomen is soft nontender positive bowel sounds. Questionable suprapubic tenderness   Muscle skeletal  Moves her extremities at baseline again has significant lower extremity weakness which is not new-ambulates in a wheelchair-has a sitter for assistance.  Psych she is oriented to self-- Is  cooperative with exam but needs prompting by sitter often--appears a bit more agitated than usual      .  Labs  10/05/2015.  Sodium 139 potassium 4.2 BUN 21 creatinine 0.7.--Liver function tests within normal limits except albumin 3.1  Uric acid 19.  WBC 5.9 hemoglobin 12.5 platelets 187.  06/08/2015.  Sodium 137 potassium 3.7 BUN 21 creatinine 0.8.  Liver function tests within normal limits except albumin of 3.1.  WBC 8.7 hemoglobin 13.3 platelets 232  03/23/2015.  Vitamin D level XXXII.5  12/15/2014.  WBC 8.5 hemoglobin 13.4 platelets 194.  Sodium 135 potassium 4.0 BUN 12 creatinine 0.5 a.  Liver function tests within normal limits except albumen of 3.4    11/20/2014.  Vitamin D level was 24.64  11/06/2014.  Sodium 138 potassium 4.1 BUN 16 creatinine 0.6.  CBC 6.0 hemoglobin 13.0 platelets 182.  08/15/2014.  Liver function tests within normal limits except albumin of 3.4.    08/13/2014.  Sodium 137 potassium 4.5 BUN 13 creatinine 0.6.  Albumin 3.4 otherwise liver function tests within normal limits  WBC 7.2 hemoglobin 12.6 platelets 208  04/23/2014.  WBC 6.7 hemoglobin 12.5 platelets 173.  Sodium 135 potassium 4.3 BUN 15 creatinine 0.6.  Liver function tests within normal limits except albumin of 3.2.  03/03/2014.  Fasting glucose 78.    03/03/2014.  Fasting glucose 78.  02/20/2014.  WBC 6.7 hemoglobin 14.5 platelets 251.  Sodium 131 potassium 4.1 BUN 14 creatinine 0.71   01/13/2014.  Urine culture grew greater than 100,000 colonies of Escherichia coli.  Hemoglobin A1c 5.5.  Liver function tests within normal limits except albumin of 3.3.  01/01/2014.  Sodium 135 potassium 4.6 BUN 12 creatinine 0.7.  10/19/2013.  WBC 6.6 hemoglobin 12.2 platelets 202.   Assessment and plan.    #1-dementia-she is on Exelon- She is also on Depakote 125 mg QHS-apparently behaviors have stabilized  Somewhat--although she's had some  increased occasion thought secondary to UTI-.  In regards UTI will start Augmentin 5 mg twice a day for 10 days as well as a probiotic twice a day for 10 days-again this is ESBL she will need contact precautions which is encouraging--    #2 hypertension--continues on Losartan -recent blood pressures appear to be fairly stable as noted above   #3-history of venous stasis changes lower extremity she is on Lasix 30 mg a day this appears relatively stable --and is followed by wound care-- we'll update a metabolic panel    #4 osteoporosis this is stable on Os-Cal with vitamin D.-She is on vitamin D supplement-  #5-history of osteoarthritic pain she is receiving Tylenol apparently with good effect-  -- .  #6-depression that she continues on Lexapro again also is on Exelon for coexistent dementia .  #7  constipation  Apparently this is stabilized on the senna as well as Colace which was recently added     #8 history of UTIs- As noted above we have started treatment for an Escherichia coli ESBL with Augmentin  #9-history of right fore head contusion apparently she had a fall with no other apparent  injuries---  no--loss of consciousness-apparently she got up out of her wheelchair and lost her footing-- neurologically appears to be intact this appears to be a healing contusion-at this point will monitor.  Will update blood work including a CBC and metabolic panel as well as Depakote level  [id number]-of note greater than 35 minutes spent assessing patient-discussing her status with her sitter-reviewing her chart and labs-and coordinating and formulating a plan of care for numerous diagnoses-of note greater than 50% of time spent coordinating plan of care              \

## 2015-12-21 DIAGNOSIS — I129 Hypertensive chronic kidney disease with stage 1 through stage 4 chronic kidney disease, or unspecified chronic kidney disease: Secondary | ICD-10-CM | POA: Diagnosis not present

## 2015-12-21 DIAGNOSIS — Z5181 Encounter for therapeutic drug level monitoring: Secondary | ICD-10-CM | POA: Diagnosis not present

## 2015-12-21 LAB — BASIC METABOLIC PANEL
BUN: 21 mg/dL (ref 4–21)
CREATININE: 0.8 mg/dL (ref <=1.1)
GLUCOSE: 1 mg/dL
POTASSIUM: 4.2 mmol/L (ref 3.4–5.3)
Sodium: 140 mmol/L (ref 137–147)

## 2015-12-21 LAB — CBC AND DIFFERENTIAL
HCT: 39 % (ref 36–46)
HEMOGLOBIN: 13 g/dL (ref 12.0–16.0)
PLATELETS: 211 10*3/uL (ref 150–399)
WBC: 8.9 10*3/mL

## 2015-12-30 ENCOUNTER — Non-Acute Institutional Stay (SKILLED_NURSING_FACILITY): Payer: Medicare HMO | Admitting: Internal Medicine

## 2015-12-30 DIAGNOSIS — H6123 Impacted cerumen, bilateral: Secondary | ICD-10-CM | POA: Diagnosis not present

## 2015-12-30 DIAGNOSIS — H6120 Impacted cerumen, unspecified ear: Secondary | ICD-10-CM | POA: Insufficient documentation

## 2015-12-30 NOTE — Progress Notes (Signed)
Patient ID: Sandra Holland, female   DOB: 12/12/1923, 82 y.o.   MRN: [id number]        This  is an acute visit.  Level of care skilled.  Facility [facility] .  Complaint-acute visit secondary to difficulty hearing suspected wax impaction  History of present illness.  Patient is a pleasant [age]year-old female who has had some increased difficulty hearing-her center who is quite attentive says this has happened before when she has had a wax impaction I'm following up on this              .  Family medical social history has been reviewed per previous progress notes --including 06/07/2015 .  [facility] Includes Tylenol 650 mg every 6 hours when necessary.  Os-Cal.  Lasix 30 mg daily.  Centrum Silver.  Lolsartan 50 mg daily.  Exelon patch.  Vitamin D.  Lexapro 15 mg daily.  Tylenol daily at bedtime.   Depakote125 mg QHS  Ativan 0.25 mg every 6 hours when necessary.  Senna.  MiraLAX daily.   .  Review of systems Limited secondary to patient being a poor historian with dementia. ----Hearing difficulties as noted above she is not complaining of ear pain  however I  Physical exam.   In general this is an elderly female in no distress sitting comfortably in her wheelchair.  Ears she does have what appears to be some significant cerumen obscuring her tympanic membranes bilaterally--I do not see any drainage or bleeding or exudate       l      .  Labs  10/05/2015.  Sodium 139 potassium 4.2 BUN 21 creatinine 0.7.--Liver function tests within normal limits except albumin 3.1  Uric acid 19.  WBC 5.9 hemoglobin 12.5 platelets 187.  06/08/2015.  Sodium 137 potassium 3.7 BUN 21 creatinine 0.8.  Liver function tests within normal limits except albumin of 3.1.  WBC 8.7 hemoglobin 13.3 platelets 232  03/23/2015.  Vitamin D level XXXII.5  12/15/2014.  WBC 8.5 hemoglobin 13.4 platelets 194.  Sodium 135  potassium 4.0 BUN 12 creatinine 0.5 a.  Liver function tests within normal limits except albumen of 3.4    11/20/2014.  Vitamin D level was 24.64  11/06/2014.  Sodium 138 potassium 4.1 BUN 16 creatinine 0.6.  CBC 6.0 hemoglobin 13.0 platelets 182.  08/15/2014.  Liver function tests within normal limits except albumin of 3.4.    08/13/2014.  Sodium 137 potassium 4.5 BUN 13 creatinine 0.6.  Albumin 3.4 otherwise liver function tests within normal limits  WBC 7.2 hemoglobin 12.6 platelets 208  04/23/2014.  WBC 6.7 hemoglobin 12.5 platelets 173.  Sodium 135 potassium 4.3 BUN 15 creatinine 0.6.  Liver function tests within normal limits except albumin of 3.2.  03/03/2014.  Fasting glucose 78.    03/03/2014.  Fasting glucose 78.  02/20/2014.  WBC 6.7 hemoglobin 14.5 platelets 251.  Sodium 131 potassium 4.1 BUN 14 creatinine 0.71   01/13/2014.  Urine culture grew greater than 100,000 colonies of Escherichia coli.  Hemoglobin A1c 5.5.  Liver function tests within normal limits except albumin of 3.3.  01/01/2014.  Sodium 135 potassium 4.6 BUN 12 creatinine 0.7.  10/19/2013.  WBC 6.6 hemoglobin 12.2 platelets 202.   Assessment and plan.  Suspected cerumen impaction-will treat with Debrox drops both ears twice a day for 3 days and flush with warm water on day 4-- the cerumen looks actually quite hard and she may need an ENT consult if Drops  are not effective    [id number]                 \

## 2016-01-06 ENCOUNTER — Non-Acute Institutional Stay (SKILLED_NURSING_FACILITY): Payer: Medicare HMO | Admitting: Internal Medicine

## 2016-01-06 DIAGNOSIS — H6123 Impacted cerumen, bilateral: Secondary | ICD-10-CM | POA: Diagnosis not present

## 2016-01-06 NOTE — Progress Notes (Signed)
Patient ID: Sandra Holland, female   DOB: 03-26-1924, 82 y.o.   MRN: [id number]         This  is an acute visit.  Level of care skilled.  Facility [facility] .  Complaint-acute visit follow up difficulty hearing suspected wax impaction  History of present illness.  Patient is a pleasant 82year-old female who has had some increased difficulty hearing-her sitter who is quite attentive says this has happened before  When I saw her on previous visit she did have significant impaction both ears and I did order DeBrox  for 3 days with flushing on day 4-however apparently the hearing difficulties are persisting according to her sitter and I am following up on this.  Initially when I saw her I thought this may be a challenge and she may have to see an ENT    .  Family medical social history has been reviewed per previous progress notes --including 06/07/2015 .  Medications have been reviewed per [facility] Includes Tylenol 650 mg every 6 hours when necessary.  Os-Cal.  Lasix 30 mg daily.  Centrum Silver.  Lolsartan 50 mg daily.  Exelon patch.  Vitamin D.  Lexapro 15 mg daily.  Tylenol daily at bedtime.   Depakote125 mg QHS  Ativan 0.25 mg every 6 hours when necessary.  Senna.  MiraLAX daily.   .  Review of systems Limited secondary to patient being a poor historian with dementia. ----Hearing difficulties as noted above she is not complaining of ear pain   I  Physical exam.   In general this is an elderly female in no distress sitting comfortably in her wheelchair.  Ears she does have what appears to be some significant cerumen obscuring her tympanic membranes bilaterally--I do not see any drainage or bleeding or exudate--I actually don't see a whole lot  different from initial exam       l      .  Labs  10/05/2015.  Sodium 139 potassium 4.2 BUN 21 creatinine 0.7.--Liver function tests within normal limits except albumin 3.1  Uric acid 19.  WBC  5.9 hemoglobin 12.5 platelets 187.  06/08/2015.  Sodium 137 potassium 3.7 BUN 21 creatinine 0.8.  Liver function tests within normal limits except albumin of 3.1.  WBC 8.7 hemoglobin 13.3 platelets 232  03/23/2015.  Vitamin D level XXXII.5  12/15/2014.  WBC 8.5 hemoglobin 13.4 platelets 194.  Sodium 135 potassium 4.0 BUN 12 creatinine 0.5 a.  Liver function tests within normal limits except albumen of 3.4    11/20/2014.  Vitamin D level was 24.64  11/06/2014.  Sodium 138 potassium 4.1 BUN 16 creatinine 0.6.  CBC 6.0 hemoglobin 13.0 platelets 182.  08/15/2014.  Liver function tests within normal limits except albumin of 3.4.    08/13/2014.  Sodium 137 potassium 4.5 BUN 13 creatinine 0.6.  Albumin 3.4 otherwise liver function tests within normal limits  WBC 7.2 hemoglobin 12.6 platelets 208  04/23/2014.  WBC 6.7 hemoglobin 12.5 platelets 173.  Sodium 135 potassium 4.3 BUN 15 creatinine 0.6.  Liver function tests within normal limits except albumin of 3.2.  03/03/2014.  Fasting glucose 78.    03/03/2014.  Fasting glucose 78.  02/20/2014.  WBC 6.7 hemoglobin 14.5 platelets 251.  Sodium 131 potassium 4.1 BUN 14 creatinine 0.71   01/13/2014.  Urine culture grew greater than 100,000 colonies of Escherichia coli.  Hemoglobin A1c 5.5.  Liver function tests within normal limits except albumin of 3.3.  01/01/2014.  Sodium 135 potassium 4.6  BUN 12 creatinine 0.7.  10/19/2013.  WBC 6.6 hemoglobin 12.2 platelets 202.   Assessment and plan.  Suspected cerumen impaction-D Brox does not appear to really have helped much-this looks to be fairly hardened -- will write an order for an ENT consult    [id number]                 \

## 2016-01-11 DIAGNOSIS — H6123 Impacted cerumen, bilateral: Secondary | ICD-10-CM | POA: Diagnosis not present

## 2016-01-11 NOTE — Progress Notes (Signed)
MRN: [id number] Name: Sandra Holland  Sex: female Age: 82 y.o. DOB: 1923/10/13  PSC #:  Facility/Room: Level Of Care: SNF Provider: Merrilee Seashore MD  Emergency Contacts: Extended Emergency Contact Information Primary Emergency Contact: Simmons,Tanya Address: [address]          [place], [place] [address] [place] Home Phone: [phone] Mobile Phone: [phone] Relation: Grandaughter Secondary Emergency Contact: Lenard Galloway Address: [address]          [place], [place] [address] [place] Home Phone: [phone] Relation: Daughter  Code Status: DNR  Allergies: Hydrocodone and Prednisone  Chief Complaint  Patient presents with  . Medical Management of Chronic Issues    HPI: Patient is 82 y.o. female who   Past Medical History  Diagnosis Date  . Dementia   . Hypertension   . Overactive bladder   . DNR (do not resuscitate)     Past Surgical History  Procedure Laterality Date  . Fracture surgery    . Joint replacement        Medication List       This list is accurate as of: 01/11/16 12:30 PM.  Always use your most recent med list.               ABC PLUS SENIOR Tabs  Take 1 tablet by mouth daily.     acetaminophen 325 MG tablet  Commonly known as:  TYLENOL  Take 650 mg by mouth every 6 (six) hours as needed. For pain     calcium-vitamin D 500-200 MG-UNIT tablet  Commonly known as:  OSCAL WITH D  Take 2 tablets by mouth daily.     divalproex 125 MG DR tablet  Commonly known as:  DEPAKOTE  Take 125 mg by mouth 2 (two) times daily. Of note Depakote is now once daily at bedtime     escitalopram 10 MG tablet  Commonly known as:  LEXAPRO  Take 15 mg by mouth daily.     food thickener Powd  Commonly known as:  THICK IT  Take 1 Container by mouth as needed (for nectar thick).     furosemide 20 MG tablet  Commonly known as:  LASIX  Give 1.5 tablets by mouth daily.     LORazepam 0.5 MG tablet   Commonly known as:  ATIVAN  Take 0.25 mg by mouth every 6 (six) hours as needed for anxiety.     losartan 50 MG tablet  Commonly known as:  COZAAR  Take 50 mg by mouth daily.     rivastigmine 9.5 mg/24hr  Commonly known as:  EXELON  Place 1 patch (9.5 mg total) onto the skin daily.     senna-docusate 8.6-50 MG tablet  Commonly known as:  Senokot-S  Take 2 tablets by mouth daily.     Vitamin D-3 5000 UNITS Tabs  Take 1 tablet by mouth once a week.        No orders of the defined types were placed in this encounter.    Immunization History  Administered Date(s) Administered  . Influenza-Unspecified 07/06/2014  . Tdap 04/10/2012    Social History  Substance Use Topics  . Smoking status: Never Smoker   . Smokeless tobacco: Never Used  . Alcohol Use: No    Review of Systems  DATA OBTAINED: from patient, nurse, medical record, family member GENERAL:  no fevers, fatigue, appetite changes SKIN: No itching, rash HEENT: No complaint RESPIRATORY: No cough, wheezing, SOB CARDIAC:  No chest pain, palpitations, lower extremity edema  GI: No abdominal pain, No N/V/D or constipation, No heartburn or reflux  GU: No dysuria, frequency or urgency, or incontinence  MUSCULOSKELETAL: No unrelieved bone/joint pain NEUROLOGIC: No headache, dizziness  PSYCHIATRIC: No overt anxiety or sadness  Filed Vitals:   01/11/16 1209  BP: 110/54  Pulse: 70  Temp: 98.4 F (36.9 C)  Resp: 16    Physical Exam  GENERAL APPEARANCE: Alert, conversant, No acute distress  SKIN: No diaphoresis rash, or wounds HEENT: Unremarkable RESPIRATORY: Breathing is even, unlabored. Lung sounds are clear   CARDIOVASCULAR: Heart RRR no murmurs, rubs or gallops. No peripheral edema  GASTROINTESTINAL: Abdomen is soft, non-tender, not distended w/ normal bowel sounds.  GENITOURINARY: Bladder non tender, not distended  MUSCULOSKELETAL: No abnormal joints or musculature NEUROLOGIC: Cranial nerves 2-12 grossly  intact. Moves all extremities PSYCHIATRIC: Mood and affect appropriate to situation, no behavioral issues  Patient Active Problem List   Diagnosis Date Noted  . Cerumen impaction 12/30/2015  . Contusion 12/20/2015  . UTI (urinary tract infection) 06/07/2015  . Hordeolum internum of right lower eyelid 02/03/2015  . Osteoporosis 06/10/2014  . Hyperglycemia 03/06/2014  . Dermatitis 03/06/2014  . Chronic venous insufficiency 12/08/2013  . Altered mental status 10/20/2013  . Complicated UTI (urinary tract infection) 05/01/2013  . Edema 04/02/2013  . Dysphagia 04/02/2013  . Depression 02/25/2013  . Constipation 02/25/2013  . Essential hypertension, benign 02/25/2013  . Alzheimer's disease 02/25/2013    CBC    Component Value Date/Time   WBC 8.9 12/21/2015   WBC 6.6 06/02/2012 0950   RBC 4.29 06/02/2012 0950   HGB 13.0 12/21/2015   HCT 39 12/21/2015   PLT 211 12/21/2015   MCV 93.2 06/02/2012 0950   LYMPHSABS 1.3 06/02/2012 0950   MONOABS 0.9 06/02/2012 0950   EOSABS 0.1 06/02/2012 0950   BASOSABS 0.1 06/02/2012 0950    CMP     Component Value Date/Time   NA 140 12/21/2015   NA 132* 06/02/2012 0950   K 4.2 12/21/2015   CL 94* 06/02/2012 0950   CO2 31 06/02/2012 0950   GLUCOSE 93 06/02/2012 0950   BUN 21 12/21/2015   BUN 10 06/02/2012 0950   CREATININE 0.8 12/21/2015   CREATININE 0.60 06/02/2012 0950   CALCIUM 9.4 06/02/2012 0950   PROT 6.4 06/02/2012 0950   ALBUMIN 3.6 06/02/2012 0950   AST 19 06/02/2012 0950   ALT 7 06/02/2012 0950   ALKPHOS 77 06/02/2012 0950   BILITOT 0.5 06/02/2012 0950   GFRNONAA 80* 06/02/2012 0950   GFRAA >90 06/02/2012 0950    Assessment and Plan  No problem-specific assessment & plan notes found for this encounter.   Alexander,Anne MD

## 2016-01-12 ENCOUNTER — Encounter: Payer: Self-pay | Admitting: Internal Medicine

## 2016-01-12 ENCOUNTER — Non-Acute Institutional Stay (SKILLED_NURSING_FACILITY): Payer: Medicare HMO | Admitting: Internal Medicine

## 2016-01-12 DIAGNOSIS — G309 Alzheimer's disease, unspecified: Secondary | ICD-10-CM

## 2016-01-12 DIAGNOSIS — G308 Other Alzheimer's disease: Secondary | ICD-10-CM

## 2016-01-12 DIAGNOSIS — F329 Major depressive disorder, single episode, unspecified: Secondary | ICD-10-CM | POA: Diagnosis not present

## 2016-01-12 DIAGNOSIS — I10 Essential (primary) hypertension: Secondary | ICD-10-CM | POA: Diagnosis not present

## 2016-01-12 DIAGNOSIS — F0281 Dementia in other diseases classified elsewhere with behavioral disturbance: Secondary | ICD-10-CM

## 2016-01-12 DIAGNOSIS — R69 Illness, unspecified: Secondary | ICD-10-CM | POA: Diagnosis not present

## 2016-01-12 DIAGNOSIS — F32A Depression, unspecified: Secondary | ICD-10-CM

## 2016-01-12 NOTE — Progress Notes (Signed)
This encounter was created in error - please disregard.

## 2016-01-22 ENCOUNTER — Encounter: Payer: Self-pay | Admitting: Internal Medicine

## 2016-01-22 NOTE — Assessment & Plan Note (Signed)
Chronic and stable - conr lexapro 15 mg daily

## 2016-01-22 NOTE — Progress Notes (Signed)
MRN: [id number] Name: Sandra Holland  Sex: female Age: 82 y.o. DOB: Jan 06, 1924  [facility] Facility/Room: Level Of Care: SNF Provider: Merrilee Seashore D Emergency Contacts: Extended Emergency Contact Information Primary Emergency Contact: Simmons,Tanya Address: [address]          [place], [place] [address] [place] Home Phone: [phone] Mobile Phone: [phone] Relation: Grandaughter Secondary Emergency Contact: Lenard Galloway Address: [address]          [place], [place] [address] [place] Home Phone: [phone] Relation: Daughter  Code Status:   Allergies: Hydrocodone and Prednisone  Chief Complaint  Patient presents with  . Medical Management of Chronic Issues    HPI: Patient is 82 y.o. female with dementia who has a sitter with her daily who is being seen for routine issues of HTN, dementia, and depression.  Past Medical History  Diagnosis Date  . Dementia   . Hypertension   . Overactive bladder   . DNR (do not resuscitate)     Past Surgical History  Procedure Laterality Date  . Fracture surgery    . Joint replacement        Medication List       This list is accurate as of: 01/12/16 11:59 PM.  Always use your most recent med list.               ABC PLUS SENIOR Tabs  Take 1 tablet by mouth daily.     acetaminophen 325 MG tablet  Commonly known as:  TYLENOL  Take 650 mg by mouth every 6 (six) hours as needed. For pain     calcium-vitamin D 500-200 MG-UNIT tablet  Commonly known as:  OSCAL WITH D  Take 2 tablets by mouth daily.     divalproex 125 MG DR tablet  Commonly known as:  DEPAKOTE  Take 125 mg by mouth 2 (two) times daily. Of note Depakote is now once daily at bedtime     escitalopram 10 MG tablet  Commonly known as:  LEXAPRO  Take 15 mg by mouth daily.     food thickener Powd  Commonly known as:  THICK IT  Take 1 Container by mouth as needed (for nectar thick).      furosemide 20 MG tablet  Commonly known as:  LASIX  Give 1.5 tablets by mouth daily.     LORazepam 0.5 MG tablet  Commonly known as:  ATIVAN  Take 0.25 mg by mouth every 6 (six) hours as needed for anxiety.     losartan 50 MG tablet  Commonly known as:  COZAAR  Take 50 mg by mouth daily.     rivastigmine 9.5 mg/24hr  Commonly known as:  EXELON  Place 1 patch (9.5 mg total) onto the skin daily.     senna-docusate 8.6-50 MG tablet  Commonly known as:  Senokot-S  Take 2 tablets by mouth daily.     Vitamin D-3 5000 UNITS Tabs  Take 1 tablet by mouth once a week.        No orders of the defined types were placed in this encounter.    Immunization History  Administered Date(s) Administered  . Influenza-Unspecified 07/06/2014  . Tdap 04/10/2012    Social History  Substance Use Topics  . Smoking status: Never Smoker   . Smokeless tobacco: Never Used  . Alcohol Use: No    Review of Systems  UTO 2/2 dementia ; nursing without concerns  Filed Vitals:   01/22/16 1220  BP: 118/71  Pulse: 78  Temp: 98.9 F (37.2 C)  Resp: 18    Physical Exam  GENERAL APPEARANCE: Alert, min conversant, No acute distress  SKIN: No diaphoresis rash, or wounds HEENT: Unremarkable RESPIRATORY: Breathing is even, unlabored. Lung sounds are clear   CARDIOVASCULAR: Heart RRR no murmurs, rubs or gallops. No peripheral edema  GASTROINTESTINAL: Abdomen is soft, non-tender, not distended w/ normal bowel sounds.  GENITOURINARY: Bladder non tender, not distended  MUSCULOSKELETAL: No abnormal joints or musculature NEUROLOGIC: Cranial nerves 2-12 grossly intact. Moves all extremities PSYCHIATRIC: dementia, no behavioral issues  Patient Active Problem List   Diagnosis Date Noted  . Cerumen impaction 12/30/2015  . Contusion 12/20/2015  . UTI (urinary tract infection) 06/07/2015  . Hordeolum internum of right lower eyelid 02/03/2015  . Osteoporosis 06/10/2014  . Hyperglycemia 03/06/2014   . Dermatitis 03/06/2014  . Chronic venous insufficiency 12/08/2013  . Altered mental status 10/20/2013  . Complicated UTI (urinary tract infection) 05/01/2013  . Edema 04/02/2013  . Dysphagia 04/02/2013  . Depression 02/25/2013  . Constipation 02/25/2013  . Essential hypertension, benign 02/25/2013  . Alzheimer's disease 02/25/2013    CBC    Component Value Date/Time   WBC 8.9 12/21/2015   WBC 6.6 06/02/2012 0950   RBC 4.29 06/02/2012 0950   HGB 13.0 12/21/2015   HCT 39 12/21/2015   PLT 211 12/21/2015   MCV 93.2 06/02/2012 0950   LYMPHSABS 1.3 06/02/2012 0950   MONOABS 0.9 06/02/2012 0950   EOSABS 0.1 06/02/2012 0950   BASOSABS 0.1 06/02/2012 0950    CMP     Component Value Date/Time   NA 140 12/21/2015   NA 132* 06/02/2012 0950   K 4.2 12/21/2015   CL 94* 06/02/2012 0950   CO2 31 06/02/2012 0950   GLUCOSE 93 06/02/2012 0950   BUN 21 12/21/2015   BUN 10 06/02/2012 0950   CREATININE 0.8 12/21/2015   CREATININE 0.60 06/02/2012 0950   CALCIUM 9.4 06/02/2012 0950   PROT 6.4 06/02/2012 0950   ALBUMIN 3.6 06/02/2012 0950   AST 19 06/02/2012 0950   ALT 7 06/02/2012 0950   ALKPHOS 77 06/02/2012 0950   BILITOT 0.5 06/02/2012 0950   GFRNONAA 80* 06/02/2012 0950   GFRAA >90 06/02/2012 0950    Assessment and Plan  Essential hypertension, benign Still, well controlled on lasix 20 mg and losartan 50 mg daily;plan -cont current meds  Alzheimer's disease Chronic and stable; plan - cont exelon  Depression Chronic and stable - conr lexapro 15 mg daily    Margit HanksALEXANDER, Adira Limburg D, MD

## 2016-01-22 NOTE — Assessment & Plan Note (Signed)
Chronic and stable; plan - cont exelon

## 2016-01-22 NOTE — Assessment & Plan Note (Signed)
Still, well controlled on lasix 20 mg and losartan 50 mg daily;plan -cont current meds

## 2016-01-27 ENCOUNTER — Non-Acute Institutional Stay (SKILLED_NURSING_FACILITY): Payer: Medicare HMO | Admitting: Internal Medicine

## 2016-01-27 DIAGNOSIS — M25521 Pain in right elbow: Secondary | ICD-10-CM

## 2016-01-27 DIAGNOSIS — N390 Urinary tract infection, site not specified: Secondary | ICD-10-CM | POA: Diagnosis not present

## 2016-01-27 DIAGNOSIS — R269 Unspecified abnormalities of gait and mobility: Secondary | ICD-10-CM

## 2016-01-27 DIAGNOSIS — M25529 Pain in unspecified elbow: Secondary | ICD-10-CM | POA: Insufficient documentation

## 2016-01-27 NOTE — Progress Notes (Signed)
Patient ID: Sandra PascalBernadine S Benway, female   DOB: 01/12/1924, 82 y.o.   MRN: [id number] MRN: [id number] Name: Sandra Holland  Sex: female Age: 82 y.o. DOB: 12/09/1923  [facility] Facility/Room: Level Of Care: SNF Provider: Edmon CrapeArlo Refugia Laneve Emergency Contacts: Extended Emergency Contact Information Primary Emergency Contact: Simmons,Tanya Address: [address]          [place], [place] [address] [place] Home Phone: [phone] Mobile Phone: [phone] Relation: Grandaughter Secondary Emergency Contact: Lenard GallowaySimmons,Patricia Ann Address: [address]          [place], [place] [address] [place] Home Phone: [phone] Relation: Daughter  Code Status:   Allergies: Hydrocodone and Prednisone  Chief Complaint  Patient presents with  . Acute Visit  Acute visit status right elbow discomfort  HPI: Patient is 82 y.o. female with dementia who is being seen for right elbow pain.  Patient sustained a fall apparently she slid out of her wheelchair earlier today-with no apparent injuries other than complaining of right elbow pain.  Apparently she did not hit her head.  We have ordered an x-ray that did not show any joint effusion did show mild osteoporosis moderate osteoarthritis but no fracture or dislocation.  She has some mild pain when the area is palpated and there is movement.  She does have Tylenol as needed for pain and at this point appears to be working fairly effectively.  Patient does not complain of any hip pain headache dizziness visual changes-she is a poor historian secondary to dementia and she usually has a sitter another than the elbow discomfort the sitter does not report any issues  Past Medical History  Diagnosis Date  . Dementia   . Hypertension   . Overactive bladder   . DNR (do not resuscitate)     Past Surgical History  Procedure Laterality Date  . Fracture surgery    . Joint replacement        Medication List        This list is accurate as of: 01/27/16 11:59 PM.  Always use your most recent med list.               ABC PLUS SENIOR Tabs  Take 1 tablet by mouth daily.     acetaminophen 325 MG tablet  Commonly known as:  TYLENOL  Take 650 mg by mouth every 6 (six) hours as needed. For pain     calcium-vitamin D 500-200 MG-UNIT tablet  Commonly known as:  OSCAL WITH D  Take 2 tablets by mouth daily.     divalproex 125 MG DR tablet  Commonly known as:  DEPAKOTE  Take 125 mg by mouth 2 (two) times daily. Of note Depakote is now once daily at bedtime     escitalopram 10 MG tablet  Commonly known as:  LEXAPRO  Take 15 mg by mouth daily.     food thickener Powd  Commonly known as:  THICK IT  Take 1 Container by mouth as needed (for nectar thick).     furosemide 20 MG tablet  Commonly known as:  LASIX  Give 1.5 tablets by mouth daily.     LORazepam 0.5 MG tablet  Commonly known as:  ATIVAN  Take 0.25 mg by mouth every 6 (six) hours as needed for anxiety.     losartan 50 MG tablet  Commonly known as:  COZAAR  Take 50 mg by mouth daily.     rivastigmine 9.5 mg/24hr  Commonly known as:  EXELON  Place 1 patch (9.5 mg total) onto the skin daily.     senna-docusate 8.6-50 MG tablet  Commonly known as:  Senokot-S  Take 2 tablets by mouth daily.     Vitamin D-3 5000 UNITS Tabs  Take 1 tablet by mouth once a week.        No orders of the defined types were placed in this encounter.    Immunization History  Administered Date(s) Administered  . Influenza-Unspecified 07/06/2014  . Tdap 04/10/2012    Social History  Substance Use Topics  . Smoking status: Never Smoker   . Smokeless tobacco: Never Used  . Alcohol Use: No    Review of Systems  UTO 2/2 dementia ; nursing without concernsOther than the elbow discomfort--please see history of present illness    Filed Vitals:   01/27/16 2138  BP: 126/58  Pulse: 76  Temp: 97.4 F (36.3 C)  Resp: 18    Physical  Exam  GENERAL APPEARANCE: Alert, min conversant, No acute distress  SKIN: No diaphoresis rash, or woundsIs a small hematoma right elbow but this appears to be more chronic than acute HEENT: Unremarkable oropharynx clear mucous membranes moist-pupils are equal round reactive to light extraocular movements intact area  I do not note any hematoma of the head or scalp area   RESPIRATORY: Breathing is even, unlabored. Lung sounds are clear   CARDIOVASCULAR: Heart RRR no murmurs, rubs or gallops. No peripheral edema  GASTROINTESTINAL: Abdomen is soft, non-tender, not distended w/ normal bowel sounds.  GENITOURINARY: Bladder non tender, not distended  MUSCULOSKELETAL: No abnormal joints or musculature some mild pain with palpation of the right elbow area with flexion and extension-other than what appears to be somewhat of a chronic bruise I do not see any edema erythema warmth or deformity Her lower legs currently are wrapped.  There are no complaints of pain with gentle flexion and extension at the hips NEUROLOGIC: Cranial nerves 2-12 grossly intact. Moves all extremities PSYCHIATRIC: dementia, no behavioral issues  Patient Active Problem List   Diagnosis Date Noted  . Elbow pain 01/27/2016  . Cerumen impaction 12/30/2015  . Contusion 12/20/2015  . UTI (urinary tract infection) 06/07/2015  . Hordeolum internum of right lower eyelid 02/03/2015  . Osteoporosis 06/10/2014  . Hyperglycemia 03/06/2014  . Dermatitis 03/06/2014  . Chronic venous insufficiency 12/08/2013  . Altered mental status 10/20/2013  . Complicated UTI (urinary tract infection) 05/01/2013  . Edema 04/02/2013  . Dysphagia 04/02/2013  . Depression 02/25/2013  . Constipation 02/25/2013  . Essential hypertension, benign 02/25/2013  . Alzheimer's disease 02/25/2013    CBC    Component Value Date/Time   WBC 8.9 12/21/2015   WBC 6.6 06/02/2012 0950   RBC 4.29 06/02/2012 0950   HGB 13.0 12/21/2015   HCT 39 12/21/2015    PLT 211 12/21/2015   MCV 93.2 06/02/2012 0950   LYMPHSABS 1.3 06/02/2012 0950   MONOABS 0.9 06/02/2012 0950   EOSABS 0.1 06/02/2012 0950   BASOSABS 0.1 06/02/2012 0950    CMP     Component Value Date/Time   NA 140 12/21/2015   NA 132* 06/02/2012 0950   K 4.2 12/21/2015   CL 94* 06/02/2012 0950   CO2 31 06/02/2012 0950   GLUCOSE 93 06/02/2012 0950   BUN 21 12/21/2015   BUN 10 06/02/2012 0950   CREATININE 0.8 12/21/2015   CREATININE 0.60 06/02/2012 0950   CALCIUM 9.4 06/02/2012 0950   PROT 6.4 06/02/2012 0950  ALBUMIN 3.6 06/02/2012 0950   AST 19 06/02/2012 0950   ALT 7 06/02/2012 0950   ALKPHOS 77 06/02/2012 0950   BILITOT 0.5 06/02/2012 0950   GFRNONAA 80* 06/02/2012 0950   GFRAA >90 06/02/2012 0950    Assessment and Plan  1 right elbow discomfort-x-ray was reassuring without any fracture dislocation or effusion-she does continue to have some mild pain with palpation of the area  And  movement she does have Tylenol as needed when necessary at this point this appears to be  adequate but will have to be monitored.  Otherwise I do not see really any injuries or discomfort resulting from the fall-at this point continue to monitor per facility protocol.  [id number]    Edmon Crape,

## 2016-01-31 ENCOUNTER — Encounter: Payer: Self-pay | Admitting: Internal Medicine

## 2016-01-31 ENCOUNTER — Non-Acute Institutional Stay (SKILLED_NURSING_FACILITY): Payer: Medicare HMO | Admitting: Internal Medicine

## 2016-01-31 DIAGNOSIS — M25521 Pain in right elbow: Secondary | ICD-10-CM | POA: Diagnosis not present

## 2016-01-31 DIAGNOSIS — N10 Acute tubulo-interstitial nephritis: Secondary | ICD-10-CM

## 2016-01-31 LAB — MICROALBUMIN, URINE
MICROALB UR: 82
MICROALB UR: 4.7

## 2016-01-31 NOTE — Progress Notes (Signed)
Patient ID: Sandra Holland, female   DOB: 12/11/1923, 82 y.o.   MRN: [id number]        This  is an acute  visit.  Level of care skilled.  Facility [facility] .  Complaint complaint-acute visit secondary UTI-follow-up right elbow discomfort   History of present illness.   Patient is a pleasant 82 year old female with a history of dementia she has been a long-term resident of this facility.  She does have a history of somewhat recurrent UTIs and apparently has some increased agitation recently with prompting another urine culture which has grown out greater than 100,000 colonies of Escherichia coli this is positive for ESBL.  She has been treated for this in the past and usually responds to a course of Augmentin.  Currently she does not appear to be febrile is not complaining of overt dysuria.  Patient did had a fall late last week and sustained some discomfort her right elbow x-ray did not show any acute process this appears to be doing better there is a little tenderness to palpation but this appears to be essentially resolved.  She is a poor historian secondary to dementia her sitter does not report any other acute issues however      .  [facility] Includes Tylenol 650 mg every 6 hours when necessary.  Os-Cal.  Lasix 30 mg daily.  Centrum Silver.  Losartan 50 mg daily.  Exelon patch.  Vitamin D.  Lexapro 15 mg daily.  Tylenol daily at bedtime.   Depakote125 mg QHS  Ativan 0.25 mg every 6 hours when necessary.  Senna.  MiraLAX daily.   .  Review of systems Limited secondary to patient being a poor historian with dementia. --obtained as well from sitter In general no fever chills noted-- Skin no rashes or increased itching- Eyes-no visual changes--.  Respiratory no increased shortness of breath  Cardiac she does not complaining of chest pain--appears to have some chronic venous stasis changes --  No nausea vomiting  has been noted or abdominal discomfort Neurologic she is not complaining of any dizziness or headache Musculoskeletal--does not have joint pain complaints at present  Psych she does have a history of dementia--- apparently some increased agitation at times  .  Physical exam.  She is afebrile pulse of 80 respirations 18 blood pressure 128/70 In general this is a pleasant elderly female in no distress  Her skin is warm and dry--her lower legs are wrapped  -she is followed by wound care-    Chest is clear to auscultation there is no labored breathing.  Heart is regular rate and rhythm with a 1-2/6 systolic murmur skeletal skeletal ambulates in a wheelchair appears to move her upper extremity septic baseline there is minimal tenderness to palpation her right elbow passive range of motion appears to be within normal limits there is a small bruise in  area but this appear somewhat chronic           .  Labs 12/22/2015  WBC 8.9 hemoglobin 13.0 platelets 211.  Sodium 140 potassium 4.2 BUN 21 creatinine 0.75  10/05/2015.  Sodium 139 potassium 4.2 BUN 21 creatinine 0.7.--Liver function tests within normal limits except albumin 3.1  Uric acid 19.  WBC 5.9 hemoglobin 12.5 platelets 187.  06/08/2015.  Sodium 137 potassium 3.7 BUN 21 creatinine 0.8.  Liver function tests within normal limits except albumin of 3.1.  WBC 8.7 hemoglobin 13.3 platelets 232  03/23/2015.  Vitamin D level XXXII.5  12/15/2014.  WBC 8.5 hemoglobin 13.4 platelets 194.  Sodium 135 potassium 4.0 BUN 12 creatinine 0.5 a.  Liver function tests within normal limits except albumen of 3.4    11/20/2014.  Vitamin D level was 24.64  11/06/2014.  Sodium 138 potassium 4.1 BUN 16 creatinine 0.6.  CBC 6.0 hemoglobin 13.0 platelets 182.  08/15/2014.  Liver function tests within normal limits except albumin of 3.4.    08/13/2014.  Sodium 137 potassium 4.5 BUN 13 creatinine 0.6.  Albumin 3.4  otherwise liver function tests within normal limits  WBC 7.2 hemoglobin 12.6 platelets 208  04/23/2014.  WBC 6.7 hemoglobin 12.5 platelets 173.  Sodium 135 potassium 4.3 BUN 15 creatinine 0.6.  Liver function tests within normal limits except albumin of 3.2.  03/03/2014.  Fasting glucose 78.    2.   Assessment and plan.  UTI-positive for Escherichia coli ESBL-will treat again with Augmentin 500 mg twice a day for 10 days and probiotic as well twice a day.  Clinically she does not appear to be septic has responded to Augmentin well in the past.  #2 history of right elbow pain status post fall again x-ray was fairly benign this appears to be resolving unremarkably at this point monitor.  [id number]                     \

## 2016-02-08 DIAGNOSIS — M79631 Pain in right forearm: Secondary | ICD-10-CM | POA: Diagnosis not present

## 2016-02-08 DIAGNOSIS — M79621 Pain in right upper arm: Secondary | ICD-10-CM | POA: Diagnosis not present

## 2016-02-09 ENCOUNTER — Non-Acute Institutional Stay (SKILLED_NURSING_FACILITY): Payer: Medicare HMO | Admitting: Internal Medicine

## 2016-02-09 ENCOUNTER — Encounter: Payer: Self-pay | Admitting: Internal Medicine

## 2016-02-09 DIAGNOSIS — A498 Other bacterial infections of unspecified site: Secondary | ICD-10-CM

## 2016-02-09 DIAGNOSIS — N390 Urinary tract infection, site not specified: Secondary | ICD-10-CM

## 2016-02-09 DIAGNOSIS — R69 Illness, unspecified: Secondary | ICD-10-CM | POA: Diagnosis not present

## 2016-02-09 DIAGNOSIS — Z452 Encounter for adjustment and management of vascular access device: Secondary | ICD-10-CM | POA: Diagnosis not present

## 2016-02-09 DIAGNOSIS — Z1612 Extended spectrum beta lactamase (ESBL) resistance: Secondary | ICD-10-CM

## 2016-02-09 NOTE — Progress Notes (Signed)
MRN: [id number] Name: Sandra Holland Howald  Sex: female Age: 82 y.o. DOB: 02/17/1924  [facility] Facility/Room:319 P Level Of Care: SNF Provider: Randon GoldsmithAnne D. Lyn HollingsheadAlexander, MD Emergency Contacts: Extended Emergency Contact Information Primary Emergency Contact: Simmons,Tanya Address: [address]          [place], [place] [address] [place] Home Phone: [phone] Mobile Phone: [phone] Relation: Grandaughter Secondary Emergency Contact: Lenard GallowaySimmons,Patricia Ann Address: [address]          [place], [place] [address] [place] Home Phone: [phone] Relation: Daughter  Code Status: DNR  Allergies: Hydrocodone and Prednisone  Chief Complaint  Patient presents with  . Acute Visit    Acute    HPI: Patient is 82 y.o. female who   Past Medical History  Diagnosis Date  . Dementia   . Hypertension   . Overactive bladder   . DNR (do not resuscitate)     Past Surgical History  Procedure Laterality Date  . Fracture surgery    . Joint replacement    . Adenoidectomy    . Bilateral cataract surgery    . Tonsillectomy    . Knee surgery    . Foot surgery        Medication List       This list is accurate as of: 02/09/16 11:59 PM.  Always use your most recent med list.               ABC PLUS SENIOR Tabs  Take 1 tablet by mouth daily.     acetaminophen 325 MG tablet  Commonly known as:  TYLENOL  Take 2 tablets (650 mg) by mouth every 6 hours as needed for pain. Also take 2 tablets by mouth (650 mg) at bedtime. Do not exceed 4000 mg APAP in 24 hours.     amoxicillin-clavulanate 500-125 MG tablet  Commonly known as:  AUGMENTIN  Take 500 mg by mouth 3 (three) times daily.     busPIRone 10 MG tablet  Commonly known as:  BUSPAR  Take 10 mg by mouth 2 (two) times daily.     calcium-vitamin D 500-200 MG-UNIT tablet  Commonly known as:  OSCAL WITH D  Take 2 tablets by mouth daily.     divalproex 125 MG DR tablet  Commonly known  as:  DEPAKOTE  Take 125 mg by mouth at bedtime.     docusate 50 MG/5ML liquid  Commonly known as:  COLACE  Take 5 ml by mouth daily.     escitalopram 10 MG tablet  Commonly known as:  LEXAPRO  Take 15 mg by mouth daily.     furosemide 20 MG tablet  Commonly known as:  LASIX  Give 1.5 tablets by mouth daily.     LORazepam 0.5 MG tablet  Commonly known as:  ATIVAN  Take 0.25 mg by mouth every 6 (six) hours as needed for anxiety.     losartan 50 MG tablet  Commonly known as:  COZAAR  Take 50 mg by mouth daily.     PROSTAT PO  Give 30 ml every day due to low albumin.     rivastigmine 9.5 mg/24hr  Commonly known as:  EXELON  Place 1 patch (9.5 mg total) onto the skin daily.     senna-docusate 8.6-50 MG tablet  Commonly known as:  Senokot-Holland  Take 2 tablets by mouth daily.     UNABLE TO FIND  Med Name: Med Pass intiate 4 oz  three times a day     UNABLE TO FIND  Med Name: Magic Cup : give with lunch and dinner trays due to weight loss.     Vitamin D-3 5000 UNITS Tabs  Take 1 tablet by mouth once a week.     D3-1000 1000 units tablet  Generic drug:  Cholecalciferol  Take 1,000 Units by mouth daily.        Meds ordered this encounter  Medications  . amoxicillin-clavulanate (AUGMENTIN) 500-125 MG tablet    Sig: Take 500 mg by mouth 3 (three) times daily.  . busPIRone (BUSPAR) 10 MG tablet    Sig: Take 10 mg by mouth 2 (two) times daily.  Marland Kitchen docusate (COLACE) 50 MG/5ML liquid    Sig: Take 5 ml by mouth daily.  . Cholecalciferol (D3-1000) 1000 units tablet    Sig: Take 1,000 Units by mouth daily.  Marland Kitchen UNABLE TO FIND    Sig: Med Name: Med Pass intiate 4 oz three times a day  . UNABLE TO FIND    Sig: Med Name: Magic Cup : give with lunch and dinner trays due to weight loss.  . Pollen Extracts (PROSTAT PO)    Sig: Give 30 ml every day due to low albumin.    Immunization History  Administered Date(Holland) Administered  . Influenza-Unspecified 07/06/2014, 06/18/2015  .  Tdap 04/10/2012    Social History  Substance Use Topics  . Smoking status: Never Smoker   . Smokeless tobacco: Never Used  . Alcohol Use: No    Review of Systems  DATA OBTAINED: from patient, nurse, medical record, family member GENERAL:  no fevers, fatigue, appetite changes SKIN: No itching, rash HEENT: No complaint RESPIRATORY: No cough, wheezing, SOB CARDIAC: No chest pain, palpitations, lower extremity edema  GI: No abdominal pain, No N/V/D or constipation, No heartburn or reflux  GU: No dysuria, frequency or urgency, or incontinence  MUSCULOSKELETAL: No unrelieved bone/joint pain NEUROLOGIC: No headache, dizziness  PSYCHIATRIC: No overt anxiety or sadness  Filed Vitals:   02/09/16 0929  BP: 142/86  Pulse: 74  Temp: 98.8 F (37.1 C)  Resp: 18    Physical Exam  GENERAL APPEARANCE: Alert, conversant, No acute distress  SKIN: No diaphoresis rash HEENT: Unremarkable RESPIRATORY: Breathing is even, unlabored. Lung sounds are clear   CARDIOVASCULAR: Heart RRR no murmurs, rubs or gallops. No peripheral edema  GASTROINTESTINAL: Abdomen is soft, non-tender, not distended w/ normal bowel sounds.  GENITOURINARY: Bladder non tender, not distended  MUSCULOSKELETAL: No abnormal joints or musculature NEUROLOGIC: Cranial nerves 2-12 grossly intact. Moves all extremities PSYCHIATRIC: Mood and affect appropriate to situation, no behavioral issues  Patient Active Problem List   Diagnosis Date Noted  . Infection due to ESBL-producing Escherichia coli 02/11/2016  . Elbow pain 01/27/2016  . Cerumen impaction 12/30/2015  . Contusion 12/20/2015  . UTI (urinary tract infection) 06/07/2015  . Hordeolum internum of right lower eyelid 02/03/2015  . Osteoporosis 06/10/2014  . Hyperglycemia 03/06/2014  . Dermatitis 03/06/2014  . Chronic venous insufficiency 12/08/2013  . Altered mental status 10/20/2013  . Complicated UTI (urinary tract infection) 05/01/2013  . Edema 04/02/2013  .  Dysphagia 04/02/2013  . Depression 02/25/2013  . Constipation 02/25/2013  . Essential hypertension, benign 02/25/2013  . Alzheimer'Holland disease 02/25/2013    CBC    Component Value Date/Time   WBC 8.9 12/21/2015   WBC 6.6 06/02/2012 0950   RBC 4.29 06/02/2012 0950   HGB 13.0 12/21/2015   HCT 39 12/21/2015  PLT 211 12/21/2015   MCV 93.2 06/02/2012 0950   LYMPHSABS 1.3 06/02/2012 0950   MONOABS 0.9 06/02/2012 0950   EOSABS 0.1 06/02/2012 0950   BASOSABS 0.1 06/02/2012 0950    CMP     Component Value Date/Time   NA 140 12/21/2015   NA 132* 06/02/2012 0950   K 4.2 12/21/2015   CL 94* 06/02/2012 0950   CO2 31 06/02/2012 0950   GLUCOSE 93 06/02/2012 0950   BUN 21 12/21/2015   BUN 10 06/02/2012 0950   CREATININE 0.8 12/21/2015   CREATININE 0.60 06/02/2012 0950   CALCIUM 9.4 06/02/2012 0950   PROT 6.4 06/02/2012 0950   ALBUMIN 3.6 06/02/2012 0950   AST 19 10/05/2015   ALT 10 10/05/2015   ALKPHOS 51 10/05/2015   BILITOT 0.5 06/02/2012 0950   GFRNONAA 80* 06/02/2012 0950   GFRAA >90 06/02/2012 0950    Assessment and Plan ESBL E Coli UTI -  Pt was dx almost a week ago , after much thought pt was put on Augmentin because it was the only po and MIC was decent;however pt still with behavoirs, treatment has failed; I spoke with Kenney Houseman, pt'Holland g daug and POA and she is OK for placing PICC and using IV imipenem; she understands why we used pills first and she understands that her gmother may pull out the PICC line; for CrCl 48 pt will be on renal dose 500 mg q 8 for 7 days  Complicated UTI (urinary tract infection) Noted    Time spent > 35 min;> 50% of time with patient was spent reviewing records, labs, tests and studies, counseling and developing plan of care  Thurston Hole D. Lyn Hollingshead, MD

## 2016-02-11 ENCOUNTER — Encounter: Payer: Self-pay | Admitting: Internal Medicine

## 2016-02-11 DIAGNOSIS — Z1612 Extended spectrum beta lactamase (ESBL) resistance: Principal | ICD-10-CM

## 2016-02-11 DIAGNOSIS — A498 Other bacterial infections of unspecified site: Secondary | ICD-10-CM | POA: Insufficient documentation

## 2016-02-11 NOTE — Assessment & Plan Note (Signed)
Noted  

## 2016-03-08 DIAGNOSIS — B351 Tinea unguium: Secondary | ICD-10-CM | POA: Diagnosis not present

## 2016-03-08 DIAGNOSIS — M79672 Pain in left foot: Secondary | ICD-10-CM | POA: Diagnosis not present

## 2016-03-08 DIAGNOSIS — M79671 Pain in right foot: Secondary | ICD-10-CM | POA: Diagnosis not present

## 2016-04-12 ENCOUNTER — Encounter: Payer: Self-pay | Admitting: Internal Medicine

## 2016-04-12 ENCOUNTER — Non-Acute Institutional Stay (SKILLED_NURSING_FACILITY): Payer: Medicare HMO | Admitting: Internal Medicine

## 2016-04-12 DIAGNOSIS — F419 Anxiety disorder, unspecified: Secondary | ICD-10-CM

## 2016-04-12 DIAGNOSIS — E559 Vitamin D deficiency, unspecified: Secondary | ICD-10-CM | POA: Diagnosis not present

## 2016-04-12 DIAGNOSIS — M810 Age-related osteoporosis without current pathological fracture: Secondary | ICD-10-CM

## 2016-04-12 DIAGNOSIS — R69 Illness, unspecified: Secondary | ICD-10-CM | POA: Diagnosis not present

## 2016-04-12 NOTE — Progress Notes (Signed)
MRN: [id number] Name: Sandra Holland  Sex: female Age: 82 y.o. DOB: 1924-02-26  PSC #:  Facility/Room: [facility] / [address] Level Of Care: SNF Provider: Randon Goldsmith. Lyn Hollingshead, MD Emergency Contacts: Extended Emergency Contact Information Primary Emergency Contact: Simmons,Tanya Address: [address]          [place], [place] [address] [place] Home Phone: [phone] Mobile Phone: [phone] Relation: Grandaughter Secondary Emergency Contact: Lenard Galloway Address: [address]          [place], [place] [address] [place] Home Phone: [phone] Relation: Daughter  Code Status: DNR  Allergies: Hydrocodone and Prednisone  Chief Complaint  Patient presents with  . Medical Management of Chronic Issues    Routine Visit    HPI: Patient is 82 y.o. female who is being seen for routine issues of osteoporosis, Vit D def and anxiety.  Past Medical History:  Diagnosis Date  . Dementia   . DNR (do not resuscitate)   . Hypertension   . Overactive bladder     Past Surgical History:  Procedure Laterality Date  . ADENOIDECTOMY    . bilateral cataract surgery    . FOOT SURGERY    . FRACTURE SURGERY    . JOINT REPLACEMENT    . KNEE SURGERY    . TONSILLECTOMY        Medication List       Accurate as of 04/12/16 11:59 PM. Always use your most recent med list.          ABC PLUS SENIOR Tabs Take 1 tablet by mouth daily.   acetaminophen 325 MG tablet Commonly known as:  TYLENOL Take 2 tablets (650 mg) by mouth every 6 hours as needed for pain. Also take 2 tablets by mouth (650 mg) at bedtime. Do not exceed 4000 mg APAP in 24 hours.   busPIRone 10 MG tablet Commonly known as:  BUSPAR Take 10 mg by mouth 2 (two) times daily.   calcium-vitamin D 500-200 MG-UNIT tablet Commonly known as:  OSCAL WITH D Take 1 tablet by mouth daily.   divalproex 125 MG DR tablet Commonly known as:  DEPAKOTE Take 125 mg by mouth 2 (two) times  daily.   docusate 50 MG/5ML liquid Commonly known as:  COLACE Take 5 ml by mouth daily.   escitalopram 10 MG tablet Commonly known as:  LEXAPRO Take 15 mg by mouth daily.   furosemide 20 MG tablet Commonly known as:  LASIX Give 1.5 tablets by mouth daily.   LORazepam 0.5 MG tablet Commonly known as:  ATIVAN Take 0.25 mg by mouth every 6 (six) hours as needed for anxiety.   losartan 50 MG tablet Commonly known as:  COZAAR Take 50 mg by mouth daily.   PROSTAT PO Give 30 ml every day due to low albumin.   rivastigmine 9.5 mg/24hr Commonly known as:  EXELON Place 1 patch (9.5 mg total) onto the skin daily.   senna-docusate 8.6-50 MG tablet Commonly known as:  Senokot-S Take 2 tablets by mouth daily.   UNABLE TO FIND Med Name: Med Pass intiate 4 oz three times a day   UNABLE TO FIND Med Name: Magic Cup : give with lunch and dinner trays due to weight loss.   UTI-STAT PO Take 30 mLs by mouth 2 (two) times daily.   Vitamin D 2000 units tablet Give 3 tablets (6000 units total) by mouth daily.       Meds ordered this encounter  Medications  . Cholecalciferol (VITAMIN D) 2000 units tablet    Sig: Give 3 tablets (6000 units total) by mouth daily.  . Cranberry-Vitamin C-Inulin (UTI-STAT PO)    Sig: Take 30 mLs by mouth 2 (two) times daily.    Immunization History  Administered Date(s) Administered  . Influenza-Unspecified 07/06/2014, 06/18/2015  . Tdap 04/10/2012    Social History  Substance Use Topics  . Smoking status: Never Smoker  . Smokeless tobacco: Never Used  . Alcohol use No    Review of Systems  UTO 2/2 dementia; nursing without concerns    Vitals:   04/12/16 0953  BP: 124/80  Pulse: 80  Resp: 18  Temp: 98.8 F (37.1 C)    Physical Exam  GENERAL APPEARANCE: Alert, conversant, No acute distress  SKIN: No diaphoresis rash HEENT: Unremarkable RESPIRATORY: Breathing is even, unlabored. Lung sounds are clear   CARDIOVASCULAR: Heart RRR  no murmurs, rubs or gallops. No peripheral edema , legs are wrapped GASTROINTESTINAL: Abdomen is soft, non-tender, not distended w/ normal bowel sounds.  GENITOURINARY: Bladder non tender, not distended  MUSCULOSKELETAL: No abnormal joints or musculature NEUROLOGIC: Cranial nerves 2-12 grossly intact. Moves all extremities PSYCHIATRIC:dementia, no behavioral issues  Patient Active Problem List   Diagnosis Date Noted  . Vitamin D deficiency 05/09/2016  . Anxiety 05/09/2016  . Infection due to ESBL-producing Escherichia coli 02/11/2016  . Elbow pain 01/27/2016  . Cerumen impaction 12/30/2015  . Contusion 12/20/2015  . UTI (urinary tract infection) 06/07/2015  . Hordeolum internum of right lower eyelid 02/03/2015  . Osteoporosis 06/10/2014  . Hyperglycemia 03/06/2014  . Dermatitis 03/06/2014  . Chronic venous insufficiency 12/08/2013  . Altered mental status 10/20/2013  . Complicated UTI (urinary tract infection) 05/01/2013  . Edema 04/02/2013  . Dysphagia 04/02/2013  . Depression 02/25/2013  . Constipation 02/25/2013  . Essential hypertension, benign 02/25/2013  . Alzheimer's disease 02/25/2013    CBC    Component Value Date/Time   WBC 8.9 12/21/2015   WBC 6.6 06/02/2012 0950   RBC 4.29 06/02/2012 0950   HGB 13.0 12/21/2015   HCT 39 12/21/2015   PLT 211 12/21/2015   MCV 93.2 06/02/2012 0950   LYMPHSABS 1.3 06/02/2012 0950   MONOABS 0.9 06/02/2012 0950   EOSABS 0.1 06/02/2012 0950   BASOSABS 0.1 06/02/2012 0950    CMP     Component Value Date/Time   NA 140 12/21/2015   K 4.2 12/21/2015   CL 94 (L) 06/02/2012 0950   CO2 31 06/02/2012 0950   GLUCOSE 93 06/02/2012 0950   BUN 21 12/21/2015   CREATININE 0.8 12/21/2015   CREATININE 0.60 06/02/2012 0950   CALCIUM 9.4 06/02/2012 0950   PROT 6.4 06/02/2012 0950   ALBUMIN 3.6 06/02/2012 0950   AST 19 10/05/2015   ALT 10 10/05/2015   ALKPHOS 51 10/05/2015   BILITOT 0.5 06/02/2012 0950   GFRNONAA 80 (L) 06/02/2012  0950   GFRAA >90 06/02/2012 0950    Assessment and Plan  Osteoporosis Pt has had no fractures; plan - cont calcium+D (oscal) and vitamin D 6000u daily  Vitamin D deficiency No fractures; cont Vitamin D 6000 u daily  Anxiety Pt does not appear anxious;well controlled; plan - cont buspar 10 mg BID and ativan 0.25 mg q6 prn   Bransyn Adami D. Lyn Hollingshead, MD

## 2016-05-09 ENCOUNTER — Non-Acute Institutional Stay (SKILLED_NURSING_FACILITY): Payer: Medicare HMO | Admitting: Internal Medicine

## 2016-05-09 ENCOUNTER — Encounter: Payer: Self-pay | Admitting: Internal Medicine

## 2016-05-09 DIAGNOSIS — F32A Depression, unspecified: Secondary | ICD-10-CM

## 2016-05-09 DIAGNOSIS — F419 Anxiety disorder, unspecified: Secondary | ICD-10-CM | POA: Insufficient documentation

## 2016-05-09 DIAGNOSIS — I10 Essential (primary) hypertension: Secondary | ICD-10-CM

## 2016-05-09 DIAGNOSIS — E559 Vitamin D deficiency, unspecified: Secondary | ICD-10-CM | POA: Insufficient documentation

## 2016-05-09 DIAGNOSIS — F0281 Dementia in other diseases classified elsewhere with behavioral disturbance: Secondary | ICD-10-CM

## 2016-05-09 DIAGNOSIS — R69 Illness, unspecified: Secondary | ICD-10-CM | POA: Diagnosis not present

## 2016-05-09 DIAGNOSIS — G309 Alzheimer's disease, unspecified: Secondary | ICD-10-CM

## 2016-05-09 DIAGNOSIS — F329 Major depressive disorder, single episode, unspecified: Secondary | ICD-10-CM

## 2016-05-09 DIAGNOSIS — G308 Other Alzheimer's disease: Secondary | ICD-10-CM | POA: Diagnosis not present

## 2016-05-09 HISTORY — DX: Vitamin D deficiency, unspecified: E55.9

## 2016-05-09 NOTE — Assessment & Plan Note (Signed)
Controlled on losartan 50 mg daily and lasix 20 mg daily

## 2016-05-09 NOTE — Assessment & Plan Note (Signed)
Pt has had no fractures; plan - cont calcium+D (oscal) and vitamin D 6000u daily

## 2016-05-09 NOTE — Assessment & Plan Note (Signed)
Pt does not appear depressed;plan - cont lexapro 15 mg daily

## 2016-05-09 NOTE — Assessment & Plan Note (Signed)
Pt is very pleasantly demented;plan - cont exelon

## 2016-05-09 NOTE — Assessment & Plan Note (Signed)
No fractures; cont Vitamin D 6000 u daily

## 2016-05-09 NOTE — Assessment & Plan Note (Addendum)
Pt does not appear anxious;well controlled; plan - cont buspar 10 mg BID and ativan 0.25 mg q6 prn

## 2016-05-09 NOTE — Progress Notes (Signed)
MRN: [id number] Name: Sandra Holland  Sex: female Age: 82 y.o. DOB: 03/08/1924  PSC #:  [facility] / [address] Level Of Care: SNF Provider: Randon GoldsmithAnne D. Lyn HollingsheadAlexander, MD Emergency Contacts: Extended Emergency Contact Information Primary Emergency Contact: Simmons,Tanya Address: [address]          [place], [place] [address] [place] Home Phone: [phone] Mobile Phone: [phone] Relation: Grandaughter Secondary Emergency Contact: Lenard GallowaySimmons,Patricia Ann Address: [address]          [place], [place] [address] [place] Home Phone: [phone] Relation: Daughter  Code Status: DNR  Allergies: Hydrocodone and Prednisone  Chief Complaint  Patient presents with  . Medical Management of Chronic Issues    Routine Visit    HPI: Patient is 82 y.o. female who is being seen for routine issues of HTN, dementia and depression.  Past Medical History:  Diagnosis Date  . Dementia   . DNR (do not resuscitate)   . Hypertension   . Overactive bladder     Past Surgical History:  Procedure Laterality Date  . ADENOIDECTOMY    . bilateral cataract surgery    . FOOT SURGERY    . FRACTURE SURGERY    . JOINT REPLACEMENT    . KNEE SURGERY    . TONSILLECTOMY        Medication List       Accurate as of 05/09/16 10:31 PM. Always use your most recent med list.          ABC PLUS SENIOR Tabs Take 1 tablet by mouth daily.   acetaminophen 325 MG tablet Commonly known as:  TYLENOL Take 2 tablets (650 mg) by mouth every 6 hours as needed for pain. Also take 2 tablets by mouth (650 mg) at bedtime. Do not exceed 4000 mg APAP in 24 hours.   busPIRone 10 MG tablet Commonly known as:  BUSPAR Take 10 mg by mouth 2 (two) times daily.   calcium-vitamin D 500-200 MG-UNIT tablet Commonly known as:  OSCAL WITH D Take 1 tablet by mouth daily.   divalproex 125 MG DR tablet Commonly known as:  DEPAKOTE Take 125 mg by mouth 2 (two) times daily.    docusate 50 MG/5ML liquid Commonly known as:  COLACE Take 5 ml by mouth daily.   escitalopram 10 MG tablet Commonly known as:  LEXAPRO Take 15 mg by mouth daily.   furosemide 20 MG tablet Commonly known as:  LASIX Give 1.5 tablets by mouth daily.   LORazepam 0.5 MG tablet Commonly known as:  ATIVAN Take 0.25 mg by mouth every 6 (six) hours as needed for anxiety.   losartan 50 MG tablet Commonly known as:  COZAAR Take 50 mg by mouth daily.   PROSTAT PO Give 30 ml every day due to low albumin.   rivastigmine 9.5 mg/24hr Commonly known as:  EXELON Place 1 patch (9.5 mg total) onto the skin daily.   senna-docusate 8.6-50 MG tablet Commonly known as:  Senokot-S Take 2 tablets by mouth daily.   UNABLE TO FIND Med Name: Med Pass intiate 4 oz three times a day   UNABLE TO FIND Med Name: Magic Cup : give with lunch and dinner trays due to weight loss.   UTI-STAT PO Take 30 mLs by mouth 2 (two) times daily.   Vitamin D 2000 units tablet Give 3 tablets (6000 units total) by mouth daily.       No orders of the defined types  were placed in this encounter.   Immunization History  Administered Date(s) Administered  . Influenza-Unspecified 07/06/2014, 06/18/2015  . Tdap 04/10/2012    Social History  Substance Use Topics  . Smoking status: Never Smoker  . Smokeless tobacco: Never Used  . Alcohol use No    Review of Systems UTO 2/2 dementia; nursing without concerns    Vitals:   05/09/16 0849  BP: 124/80  Pulse: 61  Resp: 16  Temp: 98.8 F (37.1 C)    Physical Exam  GENERAL APPEARANCE: Alert, conversant, No acute distress  SKIN: No diaphoresis rash HEENT: Unremarkable RESPIRATORY: Breathing is even, unlabored. Lung sounds are clear   CARDIOVASCULAR: Heart RRR no murmurs, rubs or gallops. No peripheral edema  GASTROINTESTINAL: Abdomen is soft, non-tender, not distended w/ normal bowel sounds.  GENITOURINARY: Bladder non tender, not distended   MUSCULOSKELETAL: No abnormal joints or musculature NEUROLOGIC: Cranial nerves 2-12 grossly intact. Moves all extremities PSYCHIATRIC:pleasantly demented, no behavioral issues  Patient Active Problem List   Diagnosis Date Noted  . Infection due to ESBL-producing Escherichia coli 02/11/2016  . Elbow pain 01/27/2016  . Cerumen impaction 12/30/2015  . Contusion 12/20/2015  . UTI (urinary tract infection) 06/07/2015  . Hordeolum internum of right lower eyelid 02/03/2015  . Osteoporosis 06/10/2014  . Hyperglycemia 03/06/2014  . Dermatitis 03/06/2014  . Chronic venous insufficiency 12/08/2013  . Altered mental status 10/20/2013  . Complicated UTI (urinary tract infection) 05/01/2013  . Edema 04/02/2013  . Dysphagia 04/02/2013  . Depression 02/25/2013  . Constipation 02/25/2013  . Essential hypertension, benign 02/25/2013  . Alzheimer's disease 02/25/2013    CBC    Component Value Date/Time   WBC 8.9 12/21/2015   WBC 6.6 06/02/2012 0950   RBC 4.29 06/02/2012 0950   HGB 13.0 12/21/2015   HCT 39 12/21/2015   PLT 211 12/21/2015   MCV 93.2 06/02/2012 0950   LYMPHSABS 1.3 06/02/2012 0950   MONOABS 0.9 06/02/2012 0950   EOSABS 0.1 06/02/2012 0950   BASOSABS 0.1 06/02/2012 0950    CMP     Component Value Date/Time   NA 140 12/21/2015   K 4.2 12/21/2015   CL 94 (L) 06/02/2012 0950   CO2 31 06/02/2012 0950   GLUCOSE 93 06/02/2012 0950   BUN 21 12/21/2015   CREATININE 0.8 12/21/2015   CREATININE 0.60 06/02/2012 0950   CALCIUM 9.4 06/02/2012 0950   PROT 6.4 06/02/2012 0950   ALBUMIN 3.6 06/02/2012 0950   AST 19 10/05/2015   ALT 10 10/05/2015   ALKPHOS 51 10/05/2015   BILITOT 0.5 06/02/2012 0950   GFRNONAA 80 (L) 06/02/2012 0950   GFRAA >90 06/02/2012 0950    Assessment and Plan  Essential hypertension, benign Controlled on losartan 50 mg daily and lasix 20 mg daily  Alzheimer's disease Pt is very pleasantly demented;plan - cont exelon  Depression Pt does not  appear depressed;plan - cont lexapro 15 mg daily   Anne D. Lyn HollingsheadAlexander, MD

## 2016-06-07 ENCOUNTER — Non-Acute Institutional Stay (SKILLED_NURSING_FACILITY): Payer: Medicare HMO | Admitting: Internal Medicine

## 2016-06-07 ENCOUNTER — Encounter: Payer: Self-pay | Admitting: Internal Medicine

## 2016-06-07 DIAGNOSIS — K5901 Slow transit constipation: Secondary | ICD-10-CM

## 2016-06-07 DIAGNOSIS — R600 Localized edema: Secondary | ICD-10-CM

## 2016-06-07 DIAGNOSIS — F39 Unspecified mood [affective] disorder: Secondary | ICD-10-CM

## 2016-06-07 NOTE — Progress Notes (Signed)
MRN: [id number] Name: Sandra Holland  Sex: female Age: 82 y.o. DOB: Jan 26, 1924  PSC #:  [facility] / [facility] Level Of Care: SNF Provider: Randon Goldsmith. Lyn Hollingshead, MD Emergency Contacts: Extended Emergency Contact Information Primary Emergency Contact: Simmons,Tanya Address: [address]          [place], [place] [address] [place] Home Phone: [phone] Mobile Phone: [phone] Relation: Grandaughter Secondary Emergency Contact: Lenard Galloway Address: [address]          [place], [place] [address] [place] Home Phone: [phone] Relation: Daughter  Code Status: DNR  Allergies: Hydrocodone and Prednisone  Chief Complaint  Patient presents with  . Medical Management of Chronic Issues    Routine Visit    HPI: Patient is 82 y.o. female who is being seen for routine issues of mood disorder, constipation and lower extremity edema.  Past Medical History:  Diagnosis Date  . Dementia   . DNR (do not resuscitate)   . Hypertension   . Overactive bladder     Past Surgical History:  Procedure Laterality Date  . ADENOIDECTOMY    . bilateral cataract surgery    . FOOT SURGERY    . FRACTURE SURGERY    . JOINT REPLACEMENT    . KNEE SURGERY    . TONSILLECTOMY        Medication List       Accurate as of 06/07/16 11:59 PM. Always use your most recent med list.          acetaminophen 325 MG tablet Commonly known as:  TYLENOL Take 2 tablets (650 mg) by mouth every 6 hours as needed for pain. Also take 2 tablets by mouth (650 mg) at bedtime. Do not exceed 4000 mg APAP in 24 hours.   busPIRone 10 MG tablet Commonly known as:  BUSPAR Take 10 mg by mouth 2 (two) times daily.   calcium-vitamin D 500-200 MG-UNIT tablet Commonly known as:  OSCAL WITH D Take 1 tablet by mouth daily.   CENTRUM SILVER PO Take 1 tablet by mouth daily.   divalproex 125 MG DR tablet Commonly known as:  DEPAKOTE Take 125 mg by mouth 2  (two) times daily.   docusate 50 MG/5ML liquid Commonly known as:  COLACE Take 5 ml by mouth daily.   escitalopram 10 MG tablet Commonly known as:  LEXAPRO Take 15 mg by mouth daily.   furosemide 20 MG tablet Commonly known as:  LASIX Give 1.5 tablets by mouth daily.   LORazepam 0.5 MG tablet Commonly known as:  ATIVAN Take 0.25 mg by mouth every 6 (six) hours as needed for anxiety.   losartan 50 MG tablet Commonly known as:  COZAAR Take 50 mg by mouth daily.   PROSTAT PO Give 30 ml every day due to low albumin.   rivastigmine 9.5 mg/24hr Commonly known as:  EXELON Place 1 patch (9.5 mg total) onto the skin daily.   senna-docusate 8.6-50 MG tablet Commonly known as:  Senokot-S Take 2 tablets by mouth daily.   UNABLE TO FIND Med Name: Med Pass intiate 4 oz three times a day   UNABLE TO FIND Med Name: Magic Cup : give with lunch and dinner trays due to weight loss.   UTI-STAT PO Take 30 mLs by mouth 2 (two) times daily.   Vitamin D 2000 units tablet Give 3 tablets (6000 units total) by mouth daily.       Meds ordered this encounter  Medications  . Multiple Vitamins-Minerals (CENTRUM SILVER PO)    Sig: Take 1 tablet by mouth daily.    Immunization History  Administered Date(s) Administered  . Influenza-Unspecified 07/06/2014, 06/18/2015  . Tdap 04/10/2012    Social History  Substance Use Topics  . Smoking status: Never Smoker  . Smokeless tobacco: Never Used  . Alcohol use No    Review of Systems  UTO 2/2 dementia;pt with sitter without concerns;nursing without concerns    Vitals:   06/07/16 0953  BP: 138/82  Pulse: 68  Resp: 18  Temp: 97.8 F (36.6 C)    Physical Exam  GENERAL APPEARANCE: Alert, conversant, No acute distress  SKIN: No diaphoresis rash HEENT: Unremarkable RESPIRATORY: Breathing is even, unlabored. Lung sounds are clear   CARDIOVASCULAR: Heart RRR no murmurs, rubs or gallops. No peripheral edema  GASTROINTESTINAL:  Abdomen is soft, non-tender, not distended w/ normal bowel sounds.  GENITOURINARY: Bladder non tender, not distended  MUSCULOSKELETAL: No abnormal joints or musculature NEUROLOGIC: Cranial nerves 2-12 grossly intact. Moves all extremities PSYCHIATRIC: pleasant dementia, no behavioral issues  Patient Active Problem List   Diagnosis Date Noted  . Mood disorder (HCC) 06/11/2016  . Bilateral lower extremity edema 06/11/2016  . Vitamin D deficiency 05/09/2016  . Anxiety 05/09/2016  . Infection due to ESBL-producing Escherichia coli 02/11/2016  . Elbow pain 01/27/2016  . Cerumen impaction 12/30/2015  . Contusion 12/20/2015  . UTI (urinary tract infection) 06/07/2015  . Hordeolum internum of right lower eyelid 02/03/2015  . Osteoporosis 06/10/2014  . Hyperglycemia 03/06/2014  . Dermatitis 03/06/2014  . Chronic venous insufficiency 12/08/2013  . Altered mental status 10/20/2013  . Complicated UTI (urinary tract infection) 05/01/2013  . Edema 04/02/2013  . Dysphagia 04/02/2013  . Depression 02/25/2013  . Constipation 02/25/2013  . Essential hypertension, benign 02/25/2013  . Alzheimer's disease 02/25/2013    CBC    Component Value Date/Time   WBC 8.9 12/21/2015   WBC 6.6 06/02/2012 0950   RBC 4.29 06/02/2012 0950   HGB 13.0 12/21/2015   HCT 39 12/21/2015   PLT 211 12/21/2015   MCV 93.2 06/02/2012 0950   LYMPHSABS 1.3 06/02/2012 0950   MONOABS 0.9 06/02/2012 0950   EOSABS 0.1 06/02/2012 0950   BASOSABS 0.1 06/02/2012 0950    CMP     Component Value Date/Time   NA 140 12/21/2015   K 4.2 12/21/2015   CL 94 (L) 06/02/2012 0950   CO2 31 06/02/2012 0950   GLUCOSE 93 06/02/2012 0950   BUN 21 12/21/2015   CREATININE 0.8 12/21/2015   CREATININE 0.60 06/02/2012 0950   CALCIUM 9.4 06/02/2012 0950   PROT 6.4 06/02/2012 0950   ALBUMIN 3.6 06/02/2012 0950   AST 19 10/05/2015   ALT 10 10/05/2015   ALKPHOS 51 10/05/2015   BILITOT 0.5 06/02/2012 0950   GFRNONAA 80 (L)  06/02/2012 0950   GFRAA >90 06/02/2012 0950    Assessment and Plan  Mood disorder (HCC) Pt without behavoirs, always very content appearing; plan to cont depakote 125 mg BID  Constipation Chronic and stable;plan to cont Senokot-S 2 po daily  Bilateral lower extremity edema Pt's legs are currently wrapped per wound care nurse;plan to cont lasix 30 mg po daily   Sergi Gellner D. Lyn HollingsheadAlexander, MD

## 2016-06-08 DIAGNOSIS — E785 Hyperlipidemia, unspecified: Secondary | ICD-10-CM | POA: Diagnosis not present

## 2016-06-08 DIAGNOSIS — I10 Essential (primary) hypertension: Secondary | ICD-10-CM | POA: Diagnosis not present

## 2016-06-08 DIAGNOSIS — E039 Hypothyroidism, unspecified: Secondary | ICD-10-CM | POA: Diagnosis not present

## 2016-06-08 DIAGNOSIS — E119 Type 2 diabetes mellitus without complications: Secondary | ICD-10-CM | POA: Diagnosis not present

## 2016-06-08 DIAGNOSIS — D649 Anemia, unspecified: Secondary | ICD-10-CM | POA: Diagnosis not present

## 2016-06-09 LAB — HEPATIC FUNCTION PANEL
ALK PHOS: 58 U/L (ref 25–125)
ALT: 9 U/L (ref 7–35)
AST: 20 U/L (ref 13–35)
BILIRUBIN, TOTAL: 0.6 mg/dL

## 2016-06-09 LAB — LIPID PANEL
Cholesterol: 195 mg/dL (ref 0–200)
HDL: 38 mg/dL (ref 35–70)
LDL CALC: 143 mg/dL
TRIGLYCERIDES: 71 mg/dL (ref 40–160)

## 2016-06-09 LAB — HEMOGLOBIN A1C: HEMOGLOBIN A1C: 5.3

## 2016-06-09 LAB — BASIC METABOLIC PANEL
BUN: 18 mg/dL (ref 4–21)
CREATININE: 0.7 mg/dL (ref 0.5–1.1)
GLUCOSE: 113 mg/dL
Potassium: 4.2 mmol/L (ref 3.4–5.3)
SODIUM: 139 mmol/L (ref 137–147)

## 2016-06-09 LAB — CBC AND DIFFERENTIAL
HEMATOCRIT: 42 % (ref 36–46)
Hemoglobin: 14.1 g/dL (ref 12.0–16.0)
PLATELETS: 241 10*3/uL (ref 150–399)
WBC: 8.1 10^3/mL

## 2016-06-09 LAB — VITAMIN D 25 HYDROXY (VIT D DEFICIENCY, FRACTURES): VIT D 25 HYDROXY: 69

## 2016-06-09 LAB — TSH: TSH: 0.92 u[IU]/mL (ref 0.41–5.90)

## 2016-06-11 ENCOUNTER — Encounter: Payer: Self-pay | Admitting: Internal Medicine

## 2016-06-11 DIAGNOSIS — F39 Unspecified mood [affective] disorder: Secondary | ICD-10-CM | POA: Insufficient documentation

## 2016-06-11 DIAGNOSIS — R600 Localized edema: Secondary | ICD-10-CM | POA: Insufficient documentation

## 2016-06-11 NOTE — Assessment & Plan Note (Signed)
Pt without behavoirs, always very content appearing; plan to cont depakote 125 mg BID

## 2016-06-11 NOTE — Assessment & Plan Note (Signed)
Pt's legs are currently wrapped per wound care nurse;plan to cont lasix 30 mg po daily

## 2016-06-11 NOTE — Assessment & Plan Note (Signed)
Chronic and stable;plan to cont Senokot-S 2 po daily

## 2016-07-06 ENCOUNTER — Encounter: Payer: Self-pay | Admitting: Internal Medicine

## 2016-07-06 ENCOUNTER — Non-Acute Institutional Stay (SKILLED_NURSING_FACILITY): Payer: Medicare HMO | Admitting: Internal Medicine

## 2016-07-06 DIAGNOSIS — F419 Anxiety disorder, unspecified: Secondary | ICD-10-CM | POA: Diagnosis not present

## 2016-07-06 DIAGNOSIS — F329 Major depressive disorder, single episode, unspecified: Secondary | ICD-10-CM

## 2016-07-06 DIAGNOSIS — R69 Illness, unspecified: Secondary | ICD-10-CM | POA: Diagnosis not present

## 2016-07-06 DIAGNOSIS — E559 Vitamin D deficiency, unspecified: Secondary | ICD-10-CM

## 2016-07-06 DIAGNOSIS — F32A Depression, unspecified: Secondary | ICD-10-CM

## 2016-07-06 NOTE — Progress Notes (Signed)
Location:  [facility] Room Number: [address] Place of Service:  SNF [phone])  Randon Goldsmith. Lyn Hollingshead, MD  Patient Care Team: Brooke Bonito, MD as PCP - General (Internal Medicine)  Extended Emergency Contact Information Primary Emergency Contact: Simmons,Tanya Address: [address]          [place], [place] [address] [place] Home Phone: [phone] Mobile Phone: [phone] Relation: Grandaughter Secondary Emergency Contact: Lenard Galloway Address: [address]          [place], [place] [address] [place] Home Phone: [phone] Relation: Daughter    Allergies: Hydrocodone and Prednisone  Chief Complaint  Patient presents with  . Medical Management of Chronic Issues    Routine Visit    HPI: Patient is 82 y.o. female who is being seen for routine issues of VitD def, anxiety and depression.  Past Medical History:  Diagnosis Date  . Dementia   . DNR (do not resuscitate)   . Hypertension   . Overactive bladder     Past Surgical History:  Procedure Laterality Date  . ADENOIDECTOMY    . bilateral cataract surgery    . FOOT SURGERY    . FRACTURE SURGERY    . JOINT REPLACEMENT    . KNEE SURGERY    . TONSILLECTOMY        Medication List       Accurate as of 07/06/16 11:59 PM. Always use your most recent med list.          acetaminophen 325 MG tablet Commonly known as:  TYLENOL Take 2 tablets (650 mg) by mouth every 6 hours as needed for pain. Also take 2 tablets by mouth (650 mg) at bedtime. Do not exceed 4000 mg APAP in 24 hours.   busPIRone 10 MG tablet Commonly known as:  BUSPAR Take 10 mg by mouth 2 (two) times daily.   calcium-vitamin D 500-200 MG-UNIT tablet Commonly known as:  OSCAL WITH D Take 1 tablet by mouth daily.   CENTRUM SILVER PO Take 1 tablet by mouth daily.   divalproex 125 MG DR tablet Commonly known as:  DEPAKOTE Take 125 mg by mouth 2 (two) times daily.   docusate  50 MG/5ML liquid Commonly known as:  COLACE Take 5 ml by mouth daily.   escitalopram 10 MG tablet Commonly known as:  LEXAPRO Take 15 mg by mouth daily.   furosemide 20 MG tablet Commonly known as:  LASIX Take 30 mg by mouth daily. Give 1.5 tablets by mouth daily.   LORazepam 0.5 MG tablet Commonly known as:  ATIVAN Take 0.25 mg by mouth every 6 (six) hours as needed for anxiety.   losartan 50 MG tablet Commonly known as:  COZAAR Take 50 mg by mouth daily.   PROSTAT PO Give 30 ml every day due to low albumin.   rivastigmine 9.5 mg/24hr Commonly known as:  EXELON Place 1 patch (9.5 mg total) onto the skin daily.   senna-docusate 8.6-50 MG tablet Commonly known as:  Senokot-S Take 2 tablets by mouth daily.   UNABLE TO FIND Med Name: Med Pass intiate 4 oz three times a day   UNABLE TO FIND Med Name: Magic Cup : give with lunch and dinner trays due to weight loss.   UTI-STAT PO Take 30 mLs by mouth 2 (two) times daily.   Vitamin D 2000 units tablet Give 3 tablets (6000 units total) by mouth daily.  No orders of the defined types were placed in this encounter.   Immunization History  Administered Date(s) Administered  . Influenza-Unspecified 07/06/2014, 06/18/2015  . Tdap 04/10/2012    Social History  Substance Use Topics  . Smoking status: Never Smoker  . Smokeless tobacco: Never Used  . Alcohol use No    Review of Systems  UTO 2/2 dementia; nursing without concerns    Vitals:   07/06/16 1110  BP: 114/75  Pulse: 66  Resp: 18  Temp: 98.8 F (37.1 C)   Body mass index is 23.35 kg/m. Physical Exam  GENERAL APPEARANCE: Alert, conversant, No acute distress  SKIN: No diaphoresis rash HEENT: Unremarkable RESPIRATORY: Breathing is even, unlabored. Lung sounds are clear   CARDIOVASCULAR: Heart RRR no murmurs, rubs or gallops. No peripheral edema  GASTROINTESTINAL: Abdomen is soft, non-tender, not distended w/ normal bowel sounds.    GENITOURINARY: Bladder non tender, not distended  MUSCULOSKELETAL: No abnormal joints or musculature NEUROLOGIC: Cranial nerves 2-12 grossly intact. Moves all extremities PSYCHIATRIC: confusion as baseline, no behavioral issues  Patient Active Problem List   Diagnosis Date Noted  . Mood disorder (HCC) 06/11/2016  . Bilateral lower extremity edema 06/11/2016  . Vitamin D deficiency 05/09/2016  . Anxiety 05/09/2016  . Infection due to ESBL-producing Escherichia coli 02/11/2016  . Elbow pain 01/27/2016  . Cerumen impaction 12/30/2015  . Contusion 12/20/2015  . UTI (urinary tract infection) 06/07/2015  . Hordeolum internum of right lower eyelid 02/03/2015  . Osteoporosis 06/10/2014  . Hyperglycemia 03/06/2014  . Dermatitis 03/06/2014  . Chronic venous insufficiency 12/08/2013  . Altered mental status 10/20/2013  . Complicated UTI (urinary tract infection) 05/01/2013  . Edema 04/02/2013  . Dysphagia 04/02/2013  . Depression 02/25/2013  . Constipation 02/25/2013  . Essential hypertension, benign 02/25/2013  . Alzheimer's disease 02/25/2013    CMP     Component Value Date/Time   NA 139 06/09/2016   K 4.2 06/09/2016   CL 94 (L) 06/02/2012 0950   CO2 31 06/02/2012 0950   GLUCOSE 93 06/02/2012 0950   BUN 18 06/09/2016   CREATININE 0.7 06/09/2016   CREATININE 0.60 06/02/2012 0950   CALCIUM 9.4 06/02/2012 0950   PROT 6.4 06/02/2012 0950   ALBUMIN 3.6 06/02/2012 0950   AST 20 06/09/2016   ALT 9 06/09/2016   ALKPHOS 58 06/09/2016   BILITOT 0.5 06/02/2012 0950   GFRNONAA 80 (L) 06/02/2012 0950   GFRAA >90 06/02/2012 0950    Recent Labs  10/05/15 12/21/15 06/09/16  NA 139 140 139  K 4.2 4.2 4.2  BUN 21 21 18   CREATININE 0.7 0.8 0.7    Recent Labs  10/05/15 06/09/16  AST 19 20  ALT 10 9  ALKPHOS 51 58    Recent Labs  10/05/15 12/21/15 06/09/16  WBC 5.9 8.9 8.1  HGB 12.5 13.0 14.1  HCT 37 39 42  PLT 187 211 241    Recent Labs  06/09/16  CHOL 195  LDLCALC  143  TRIG 71   Lab Results  Component Value Date   MICROALBUR 82 01/31/2016   MICROALBUR 4.7 01/31/2016   Lab Results  Component Value Date   TSH 0.92 06/09/2016   Lab Results  Component Value Date   HGBA1C 5.3 06/09/2016   Lab Results  Component Value Date   CHOL 195 06/09/2016   HDL 38 06/09/2016   LDLCALC 143 06/09/2016   TRIG 71 06/09/2016    Significant Diagnostic Results in last 30 days:  No results  found.  Assessment and Plan  Vitamin D deficiency Vit D level 69; will dec VitD from 6000u daily to 2000u daily  Anxiety Pt never appears anxious;plan to cont buspar 10 mg TID; can d/c prn ativan  Depression Stable, no declines in mood;plan to cont lexapro 15 mg daily     Shaylea Ucci D. Lyn HollingsheadAlexander, MD

## 2016-07-16 NOTE — Assessment & Plan Note (Signed)
Stable, no declines in mood;plan to cont lexapro 15 mg daily

## 2016-07-16 NOTE — Assessment & Plan Note (Signed)
Pt never appears anxious;plan to cont buspar 10 mg TID; can d/c prn ativan

## 2016-07-16 NOTE — Assessment & Plan Note (Signed)
Vit D level 69; will dec VitD from 6000u daily to 2000u daily

## 2016-08-03 ENCOUNTER — Encounter: Payer: Self-pay | Admitting: Internal Medicine

## 2016-08-03 ENCOUNTER — Non-Acute Institutional Stay (SKILLED_NURSING_FACILITY): Payer: Medicare HMO | Admitting: Internal Medicine

## 2016-08-03 DIAGNOSIS — F0281 Dementia in other diseases classified elsewhere with behavioral disturbance: Secondary | ICD-10-CM

## 2016-08-03 DIAGNOSIS — M810 Age-related osteoporosis without current pathological fracture: Secondary | ICD-10-CM | POA: Diagnosis not present

## 2016-08-03 DIAGNOSIS — I10 Essential (primary) hypertension: Secondary | ICD-10-CM

## 2016-08-03 DIAGNOSIS — R69 Illness, unspecified: Secondary | ICD-10-CM | POA: Diagnosis not present

## 2016-08-03 DIAGNOSIS — B351 Tinea unguium: Secondary | ICD-10-CM | POA: Diagnosis not present

## 2016-08-03 DIAGNOSIS — G308 Other Alzheimer's disease: Secondary | ICD-10-CM

## 2016-08-03 DIAGNOSIS — G309 Alzheimer's disease, unspecified: Secondary | ICD-10-CM

## 2016-08-03 NOTE — Progress Notes (Signed)
Opened in error; Disregard.

## 2016-08-03 NOTE — Progress Notes (Signed)
[facility] Room Number: [phone] Place of Service:  SNF [phone])  Randon Goldsmithnne D. Lyn HollingsheadAlexander, MD  Patient Care Team: Brooke BonitoWarren Gallemore, MD as PCP - General (Internal Medicine)  Extended Emergency Contact Information Primary Emergency Contact: Simmons,Tanya Address: [address]          [place], [place] [address] [place] Home Phone: [phone] Mobile Phone: [phone] Relation: Grandaughter Secondary Emergency Contact: Lenard GallowaySimmons,Patricia Ann Address: [address]          [place], [place] [address] [place] Home Phone: [phone] Relation: Daughter    Allergies: Hydrocodone and Prednisone  Chief Complaint  Patient presents with  . Medical Management of Chronic Issues    Routine Visit    HPI: Patient is 82 y.o. female who is being seen for routine issues of HTN, Alzheimers dx and osteoporosis.  Past Medical History:  Diagnosis Date  . Dementia   . DNR (do not resuscitate)   . Hypertension   . Overactive bladder     Past Surgical History:  Procedure Laterality Date  . ADENOIDECTOMY    . bilateral cataract surgery    . FOOT SURGERY    . FRACTURE SURGERY    . JOINT REPLACEMENT    . KNEE SURGERY    . TONSILLECTOMY        Medication List       Accurate as of 08/03/16 11:59 PM. Always use your most recent med list.          acetaminophen 325 MG tablet Commonly known as:  TYLENOL Take 2 tablets (650 mg) by mouth every 6 hours as needed for pain. Also take 2 tablets by mouth (650 mg) at bedtime. Do not exceed 4000 mg APAP in 24 hours.   busPIRone 10 MG tablet Commonly known as:  BUSPAR Take 10 mg by mouth 2 (two) times daily.   calcium-vitamin D 500-200 MG-UNIT tablet Commonly known as:  OSCAL WITH D Take 1 tablet by mouth daily.   CENTRUM SILVER PO Take 1 tablet by mouth daily.   divalproex 125 MG DR tablet Commonly known as:  DEPAKOTE Take 125 mg by mouth 2 (two) times daily.     docusate 50 MG/5ML liquid Commonly known as:  COLACE Take 5 ml by mouth daily.   escitalopram 10 MG tablet Commonly known as:  LEXAPRO Take 15 mg by mouth daily.   furosemide 20 MG tablet Commonly known as:  LASIX Take 30 mg by mouth daily. Give 1.5 tablets by mouth daily.   losartan 50 MG tablet Commonly known as:  COZAAR Take 50 mg by mouth daily.   PROSTAT PO Give 30 ml every day due to low albumin.   rivastigmine 9.5 mg/24hr Commonly known as:  EXELON Place 1 patch (9.5 mg total) onto the skin daily.   senna-docusate 8.6-50 MG tablet Commonly known as:  Senokot-S Take 2 tablets by mouth daily.   UNABLE TO FIND Med Name: Med Pass intiate 4 oz three times a day   UNABLE TO FIND Med Name: Magic Cup : give with lunch and dinner trays due to weight loss.   UTI-STAT PO Take 30 mLs by mouth 2 (two) times daily.   Vitamin D 2000 units tablet Take 2,000 Units by mouth daily.       No orders of the defined types were placed in this encounter.   Immunization History  Administered Date(s) Administered  . Influenza-Unspecified 07/06/2014, 06/18/2015  .  Tdap 04/10/2012    Social History  Substance Use Topics  . Smoking status: Never Smoker  . Smokeless tobacco: Never Used  . Alcohol use No    Review of Systems  UTO 2/2 dementia; nursing without concerns    Vitals:   08/03/16 1424  BP: 126/74  Pulse: 83  Resp: 20  Temp: 98.8 F (37.1 C)   Body mass index is 23.74 kg/m. Physical Exam  GENERAL APPEARANCE: Alert, conversant, No acute distress  SKIN: No diaphoresis rash HEENT: Unremarkable RESPIRATORY: Breathing is even, unlabored. Lung sounds are clear   CARDIOVASCULAR: Heart RRR no murmurs, rubs or gallops. No peripheral edema  GASTROINTESTINAL: Abdomen is soft, non-tender, not distended w/ normal bowel sounds.  GENITOURINARY: Bladder non tender, not distended  MUSCULOSKELETAL: No abnormal joints or musculature NEUROLOGIC: Cranial nerves 2-12  grossly intact. Moves all extremities PSYCHIATRIC: pleasantly demented, no behavioral issues  Patient Active Problem List   Diagnosis Date Noted  . Mood disorder (HCC) 06/11/2016  . Bilateral lower extremity edema 06/11/2016  . Vitamin D deficiency 05/09/2016  . Anxiety 05/09/2016  . Infection due to ESBL-producing Escherichia coli 02/11/2016  . Elbow pain 01/27/2016  . Cerumen impaction 12/30/2015  . Contusion 12/20/2015  . UTI (urinary tract infection) 06/07/2015  . Hordeolum internum of right lower eyelid 02/03/2015  . Osteoporosis 06/10/2014  . Hyperglycemia 03/06/2014  . Dermatitis 03/06/2014  . Chronic venous insufficiency 12/08/2013  . Altered mental status 10/20/2013  . Complicated UTI (urinary tract infection) 05/01/2013  . Edema 04/02/2013  . Dysphagia 04/02/2013  . Depression 02/25/2013  . Constipation 02/25/2013  . Essential hypertension, benign 02/25/2013  . Alzheimer's disease 02/25/2013    CMP     Component Value Date/Time   NA 139 06/09/2016   K 4.2 06/09/2016   CL 94 (L) 06/02/2012 0950   CO2 31 06/02/2012 0950   GLUCOSE 93 06/02/2012 0950   BUN 18 06/09/2016   CREATININE 0.7 06/09/2016   CREATININE 0.60 06/02/2012 0950   CALCIUM 9.4 06/02/2012 0950   PROT 6.4 06/02/2012 0950   ALBUMIN 3.6 06/02/2012 0950   AST 20 06/09/2016   ALT 9 06/09/2016   ALKPHOS 58 06/09/2016   BILITOT 0.5 06/02/2012 0950   GFRNONAA 80 (L) 06/02/2012 0950   GFRAA >90 06/02/2012 0950    Recent Labs  10/05/15 12/21/15 06/09/16  NA 139 140 139  K 4.2 4.2 4.2  BUN 21 21 18   CREATININE 0.7 0.8 0.7    Recent Labs  10/05/15 06/09/16  AST 19 20  ALT 10 9  ALKPHOS 51 58    Recent Labs  10/05/15 12/21/15 06/09/16  WBC 5.9 8.9 8.1  HGB 12.5 13.0 14.1  HCT 37 39 42  PLT 187 211 241    Recent Labs  06/09/16  CHOL 195  LDLCALC 143  TRIG 71   Lab Results  Component Value Date   MICROALBUR 82 01/31/2016   MICROALBUR 4.7 01/31/2016   Lab Results  Component  Value Date   TSH 0.92 06/09/2016   Lab Results  Component Value Date   HGBA1C 5.3 06/09/2016   Lab Results  Component Value Date   CHOL 195 06/09/2016   HDL 38 06/09/2016   LDLCALC 143 06/09/2016   TRIG 71 06/09/2016    Significant Diagnostic Results in last 30 days:  No results found.  Assessment and Plan  Essential hypertension, benign Well controlled on losartan 50 mg daily and lasix 30 mg daily  Alzheimer's disease Pleasantly demented, no major  declines;plan to cont exelon 9.5 mg patch daily  Osteoporosis Pt has had no fractures;plan to cont oscal + d 500-200 along with Vit D 2000 u daily     Adain Geurin D. Lyn Hollingshead, MD

## 2016-08-05 ENCOUNTER — Encounter: Payer: Self-pay | Admitting: Internal Medicine

## 2016-08-05 NOTE — Assessment & Plan Note (Signed)
Pleasantly demented, no major declines;plan to cont exelon 9.5 mg patch daily

## 2016-08-05 NOTE — Assessment & Plan Note (Signed)
Pt has had no fractures;plan to cont oscal + d 500-200 along with Vit D 2000 u daily

## 2016-08-05 NOTE — Assessment & Plan Note (Signed)
Well controlled on losartan 50 mg daily and lasix 30 mg daily

## 2016-08-22 ENCOUNTER — Encounter: Payer: Self-pay | Admitting: Internal Medicine

## 2016-08-22 ENCOUNTER — Non-Acute Institutional Stay (SKILLED_NURSING_FACILITY): Payer: Medicare HMO | Admitting: Internal Medicine

## 2016-08-22 DIAGNOSIS — B9789 Other viral agents as the cause of diseases classified elsewhere: Secondary | ICD-10-CM

## 2016-08-22 DIAGNOSIS — J069 Acute upper respiratory infection, unspecified: Secondary | ICD-10-CM

## 2016-08-22 NOTE — Progress Notes (Signed)
Location:  [facility] Room Number: [id number] Place of Service:  SNF [phone])  Randon Goldsmith. Lyn Hollingshead, MD  Patient Care Team: Brooke Bonito, MD as PCP - General (Internal Medicine)  Extended Emergency Contact Information Primary Emergency Contact: Simmons,Tanya Address: [address]          [place], [place] [address] [place] Home Phone: [phone] Mobile Phone: [phone] Relation: Grandaughter Secondary Emergency Contact: Lenard Galloway Address: [address]          [place], [place] [address] [place] Home Phone: [phone] Relation: Daughter    Allergies: Hydrocodone and Prednisone  Chief Complaint  Patient presents with  . Acute Visit    Acute    HPI: Patient is 82 y.o. female who is being seen acutely for reports of cold sx and cough. Pt's sitter said that she has had clear nasal d/c but no cough, no chest sounds,per nursing no fever; per sitter she is not acting differently than usual.  Past Medical History:  Diagnosis Date  . Dementia   . DNR (do not resuscitate)   . Hypertension   . Overactive bladder     Past Surgical History:  Procedure Laterality Date  . ADENOIDECTOMY    . bilateral cataract surgery    . FOOT SURGERY    . FRACTURE SURGERY    . JOINT REPLACEMENT    . KNEE SURGERY    . TONSILLECTOMY        Medication List       Accurate as of 08/22/16  2:13 PM. Always use your most recent med list.          acetaminophen 325 MG tablet Commonly known as:  TYLENOL Take 2 tablets (650 mg) by mouth every 6 hours as needed for pain. Also take 2 tablets by mouth (650 mg) at bedtime. Do not exceed 4000 mg APAP in 24 hours.   busPIRone 10 MG tablet Commonly known as:  BUSPAR Take 10 mg by mouth 2 (two) times daily.   calcium-vitamin D 500-200 MG-UNIT tablet Commonly known as:  OSCAL WITH D Take 1 tablet by mouth daily.   CENTRUM SILVER PO Take 1 tablet by mouth daily.     divalproex 125 MG DR tablet Commonly known as:  DEPAKOTE Take 125 mg by mouth 2 (two) times daily.   docusate 50 MG/5ML liquid Commonly known as:  COLACE Take 5 ml by mouth daily.   escitalopram 10 MG tablet Commonly known as:  LEXAPRO Take 15 mg by mouth daily.   feeding supplement (PRO-STAT SUGAR FREE 64) Liqd Take 30 mLs by mouth 2 (two) times daily.   furosemide 20 MG tablet Commonly known as:  LASIX Take 30 mg by mouth daily. Give 1.5 tablets by mouth daily.   losartan 50 MG tablet Commonly known as:  COZAAR Take 50 mg by mouth daily.   rivastigmine 9.5 mg/24hr Commonly known as:  EXELON Place 1 patch (9.5 mg total) onto the skin daily.   senna-docusate 8.6-50 MG tablet Commonly known as:  Senokot-S Take 2 tablets by mouth daily.   UNABLE TO FIND Med Name: Med Pass intiate 4 oz three times a day   UNABLE TO FIND Med Name: Magic Cup : give with lunch and dinner trays due to weight loss.   UTI-STAT PO Take 30 mLs by mouth 2 (two) times daily.   Vitamin D 2000 units tablet Take 2,000 Units by mouth daily.  No orders of the defined types were placed in this encounter.   Immunization History  Administered Date(s) Administered  . Influenza-Unspecified 07/06/2014, 06/18/2015  . Tdap 04/10/2012    Social History  Substance Use Topics  . Smoking status: Never Smoker  . Smokeless tobacco: Never Used  . Alcohol use No    Review of Systems  UTO 2/2 dementia; HPI per sitter and nursing    Vitals:   08/22/16 1411  BP: (!) 143/73  Pulse: 80  Resp: 18  Temp: 97.3 F (36.3 C)   Body mass index is 23.81 kg/m. Physical Exam  GENERAL APPEARANCE: Alert, conversant, No acute distress  SKIN: No diaphoresis rash HEENT: Unremarkable; no pharyngeal redness or d/c RESPIRATORY: Breathing is even, unlabored. Lung sounds are clear   CARDIOVASCULAR: Heart RRR no murmurs, rubs or gallops. No peripheral edema  GASTROINTESTINAL: Abdomen is soft,  non-tender, not distended w/ normal bowel sounds.  GENITOURINARY: Bladder non tender, not distended  MUSCULOSKELETAL: No abnormal joints or musculature NEUROLOGIC: Cranial nerves 2-12 grossly intact. Moves all extremities PSYCHIATRIC: dementia, no behavioral issues  Patient Active Problem List   Diagnosis Date Noted  . Mood disorder (HCC) 06/11/2016  . Bilateral lower extremity edema 06/11/2016  . Vitamin D deficiency 05/09/2016  . Anxiety 05/09/2016  . Infection due to ESBL-producing Escherichia coli 02/11/2016  . Elbow pain 01/27/2016  . Cerumen impaction 12/30/2015  . Contusion 12/20/2015  . UTI (urinary tract infection) 06/07/2015  . Hordeolum internum of right lower eyelid 02/03/2015  . Osteoporosis 06/10/2014  . Hyperglycemia 03/06/2014  . Dermatitis 03/06/2014  . Chronic venous insufficiency 12/08/2013  . Altered mental status 10/20/2013  . Complicated UTI (urinary tract infection) 05/01/2013  . Edema 04/02/2013  . Dysphagia 04/02/2013  . Depression 02/25/2013  . Constipation 02/25/2013  . Essential hypertension, benign 02/25/2013  . Alzheimer's disease 02/25/2013    CMP     Component Value Date/Time   NA 139 06/09/2016   K 4.2 06/09/2016   CL 94 (L) 06/02/2012 0950   CO2 31 06/02/2012 0950   GLUCOSE 93 06/02/2012 0950   BUN 18 06/09/2016   CREATININE 0.7 06/09/2016   CREATININE 0.60 06/02/2012 0950   CALCIUM 9.4 06/02/2012 0950   PROT 6.4 06/02/2012 0950   ALBUMIN 3.6 06/02/2012 0950   AST 20 06/09/2016   ALT 9 06/09/2016   ALKPHOS 58 06/09/2016   BILITOT 0.5 06/02/2012 0950   GFRNONAA 80 (L) 06/02/2012 0950   GFRAA >90 06/02/2012 0950    Recent Labs  10/05/15 12/21/15 06/09/16  NA 139 140 139  K 4.2 4.2 4.2  BUN 21 21 18   CREATININE 0.7 0.8 0.7    Recent Labs  10/05/15 06/09/16  AST 19 20  ALT 10 9  ALKPHOS 51 58    Recent Labs  10/05/15 12/21/15 06/09/16  WBC 5.9 8.9 8.1  HGB 12.5 13.0 14.1  HCT 37 39 42  PLT 187 211 241    Recent  Labs  06/09/16  CHOL 195  LDLCALC 143  TRIG 71   Lab Results  Component Value Date   MICROALBUR 82 01/31/2016   MICROALBUR 4.7 01/31/2016   Lab Results  Component Value Date   TSH 0.92 06/09/2016   Lab Results  Component Value Date   HGBA1C 5.3 06/09/2016   Lab Results  Component Value Date   CHOL 195 06/09/2016   HDL 38 06/09/2016   LDLCALC 143 06/09/2016   TRIG 71 06/09/2016    Significant Diagnostic Results in last  30 days:  No results found.  Assessment and Plan  URI - - appears upper resp, not lower and appears viral at this time; pt has no symproms that are bothering her;  plan to cont to monitor     Geraldina Parrott D. Lyn HollingsheadAlexander, MD

## 2016-08-31 DIAGNOSIS — F411 Generalized anxiety disorder: Secondary | ICD-10-CM | POA: Diagnosis not present

## 2016-08-31 DIAGNOSIS — R69 Illness, unspecified: Secondary | ICD-10-CM | POA: Diagnosis not present

## 2016-08-31 DIAGNOSIS — F0390 Unspecified dementia without behavioral disturbance: Secondary | ICD-10-CM | POA: Diagnosis not present

## 2016-08-31 DIAGNOSIS — F39 Unspecified mood [affective] disorder: Secondary | ICD-10-CM | POA: Diagnosis not present

## 2016-09-01 ENCOUNTER — Non-Acute Institutional Stay (SKILLED_NURSING_FACILITY): Payer: Medicare HMO | Admitting: Internal Medicine

## 2016-09-01 ENCOUNTER — Encounter: Payer: Self-pay | Admitting: Internal Medicine

## 2016-09-01 DIAGNOSIS — K5901 Slow transit constipation: Secondary | ICD-10-CM | POA: Diagnosis not present

## 2016-09-01 DIAGNOSIS — F39 Unspecified mood [affective] disorder: Secondary | ICD-10-CM | POA: Diagnosis not present

## 2016-09-01 DIAGNOSIS — R600 Localized edema: Secondary | ICD-10-CM | POA: Diagnosis not present

## 2016-09-01 DIAGNOSIS — R69 Illness, unspecified: Secondary | ICD-10-CM | POA: Diagnosis not present

## 2016-09-01 NOTE — Progress Notes (Signed)
[facility] Room Number: [id number] Place of Service:  SNF (31)  Randon Goldsmithnne D. Lyn HollingsheadAlexander, MD  Patient Care Team: Brooke BonitoWarren Gallemore, MD as PCP - General (Internal Medicine)  Extended Emergency Contact Information Primary Emergency Contact: Simmons,Tanya Address: [address]          [place], [place] [address] [place] Home Phone: [phone] Mobile Phone: [phone] Relation: Grandaughter Secondary Emergency Contact: Lenard GallowaySimmons,Patricia Ann Address: [address]          [place], [place] [address] [place] Home Phone: [phone] Relation: Daughter    Allergies: Hydrocodone and Prednisone  Chief Complaint  Patient presents with  . Medical Management of Chronic Issues    Routine Visit    HPI: Patient is 82 y.o. female who is being seen for routine issues of LE edema, mood disorder and constipation.  Past Medical History:  Diagnosis Date  . Dementia   . DNR (do not resuscitate)   . Hypertension   . Overactive bladder     Past Surgical History:  Procedure Laterality Date  . ADENOIDECTOMY    . bilateral cataract surgery    . FOOT SURGERY    . FRACTURE SURGERY    . JOINT REPLACEMENT    . KNEE SURGERY    . TONSILLECTOMY      Allergies as of 09/01/2016      Reactions   Hydrocodone    Prednisone       Medication List       Accurate as of 09/01/16 11:59 PM. Always use your most recent med list.          acetaminophen 325 MG tablet Commonly known as:  TYLENOL Take 2 tablets (650 mg) by mouth every 6 hours as needed for pain. Also take 2 tablets by mouth (650 mg) at bedtime. Do not exceed 4000 mg APAP in 24 hours.   busPIRone 10 MG tablet Commonly known as:  BUSPAR Take 10 mg by mouth 2 (two) times daily.   calcium-vitamin D 500-200 MG-UNIT tablet Commonly known as:  OSCAL WITH D Take 1 tablet by mouth daily.   CENTRUM SILVER PO Take 1 tablet by mouth daily.   divalproex 125 MG DR  tablet Commonly known as:  DEPAKOTE Take 125 mg by mouth 2 (two) times daily.   docusate 50 MG/5ML liquid Commonly known as:  COLACE Take 5 ml by mouth daily.   escitalopram 10 MG tablet Commonly known as:  LEXAPRO Take 15 mg by mouth daily.   feeding supplement (PRO-STAT SUGAR FREE 64) Liqd Take 30 mLs by mouth 2 (two) times daily.   furosemide 20 MG tablet Commonly known as:  LASIX Take 30 mg by mouth daily. Give 1.5 tablets by mouth daily.   losartan 50 MG tablet Commonly known as:  COZAAR Take 50 mg by mouth daily.   rivastigmine 9.5 mg/24hr Commonly known as:  EXELON Place 1 patch (9.5 mg total) onto the skin daily.   senna-docusate 8.6-50 MG tablet Commonly known as:  Senokot-S Take 2 tablets by mouth daily.   UNABLE TO FIND Med Name: Med Pass intiate 4 oz three times a day   UNABLE TO FIND Med Name: Magic Cup : give with lunch and dinner trays due to weight loss.   UTI-STAT PO Take 30 mLs by mouth 2 (two) times daily.   Vitamin D 2000 units tablet Take 2,000 Units by mouth daily.  No orders of the defined types were placed in this encounter.   Immunization History  Administered Date(s) Administered  . Influenza-Unspecified 07/06/2014, 06/18/2015  . Tdap 04/10/2012    Social History  Substance Use Topics  . Smoking status: Never Smoker  . Smokeless tobacco: Never Used  . Alcohol use No    Review of Systems  UTO 2/2 dementia    Vitals:   09/01/16 0942  BP: (!) 113/55  Pulse: 74  Resp: 18  Temp: 98.6 F (37 C)   Body mass index is 23.77 kg/m. Physical Exam  GENERAL APPEARANCE: Alert, conversant, No acute distress  SKIN: No diaphoresis rash HEENT: Unremarkable RESPIRATORY: Breathing is even, unlabored. Lung sounds are clear   CARDIOVASCULAR: Heart RRR no murmurs, rubs or gallops. 1+ peripheral edema  GASTROINTESTINAL: Abdomen is soft, non-tender, not distended w/ normal bowel sounds.  GENITOURINARY: Bladder non tender, not  distended  MUSCULOSKELETAL: No abnormal joints or musculature NEUROLOGIC: Cranial nerves 2-12 grossly intact. Moves all extremities PSYCHIATRIC: dementia, no behavioral issues  Patient Active Problem List   Diagnosis Date Noted  . Mood disorder (HCC) 06/11/2016  . Bilateral lower extremity edema 06/11/2016  . Vitamin D deficiency 05/09/2016  . Anxiety 05/09/2016  . Infection due to ESBL-producing Escherichia coli 02/11/2016  . Elbow pain 01/27/2016  . Cerumen impaction 12/30/2015  . Contusion 12/20/2015  . UTI (urinary tract infection) 06/07/2015  . Hordeolum internum of right lower eyelid 02/03/2015  . Osteoporosis 06/10/2014  . Hyperglycemia 03/06/2014  . Dermatitis 03/06/2014  . Chronic venous insufficiency 12/08/2013  . Altered mental status 10/20/2013  . Complicated UTI (urinary tract infection) 05/01/2013  . Edema 04/02/2013  . Dysphagia 04/02/2013  . Depression 02/25/2013  . Constipation 02/25/2013  . Essential hypertension, benign 02/25/2013  . Alzheimer's disease 02/25/2013    CMP     Component Value Date/Time   NA 139 06/09/2016   K 4.2 06/09/2016   CL 94 (L) 06/02/2012 0950   CO2 31 06/02/2012 0950   GLUCOSE 93 06/02/2012 0950   BUN 18 06/09/2016   CREATININE 0.7 06/09/2016   CREATININE 0.60 06/02/2012 0950   CALCIUM 9.4 06/02/2012 0950   PROT 6.4 06/02/2012 0950   ALBUMIN 3.6 06/02/2012 0950   AST 20 06/09/2016   ALT 9 06/09/2016   ALKPHOS 58 06/09/2016   BILITOT 0.5 06/02/2012 0950   GFRNONAA 80 (L) 06/02/2012 0950   GFRAA >90 06/02/2012 0950    Recent Labs  10/05/15 12/21/15 06/09/16  NA 139 140 139  K 4.2 4.2 4.2  BUN 21 21 18   CREATININE 0.7 0.8 0.7    Recent Labs  10/05/15 06/09/16  AST 19 20  ALT 10 9  ALKPHOS 51 58    Recent Labs  10/05/15 12/21/15 06/09/16  WBC 5.9 8.9 8.1  HGB 12.5 13.0 14.1  HCT 37 39 42  PLT 187 211 241    Recent Labs  06/09/16  CHOL 195  LDLCALC 143  TRIG 71   Lab Results  Component Value Date    MICROALBUR 82 01/31/2016   MICROALBUR 4.7 01/31/2016   Lab Results  Component Value Date   TSH 0.92 06/09/2016   Lab Results  Component Value Date   HGBA1C 5.3 06/09/2016   Lab Results  Component Value Date   CHOL 195 06/09/2016   HDL 38 06/09/2016   LDLCALC 143 06/09/2016   TRIG 71 06/09/2016    Significant Diagnostic Results in last 30 days:  No results found.  Assessment and Plan  Bilateral lower extremity edema Chronic, waxes and wanes; plan to cont lasix 30 mg daily and prn leg wraps  Mood disorder (HCC) Per nursing pt sundown at about 4pm and her personality completely changes; will cont depakote 125 mg BID and I have started ativan scheduled for 4p daily; will monitor   Constipation Stable and chronic on senna -s 2 po daily;plan to cont current meds    Anne D. Lyn Hollingshead, MD

## 2016-09-03 ENCOUNTER — Encounter: Payer: Self-pay | Admitting: Internal Medicine

## 2016-09-03 NOTE — Assessment & Plan Note (Signed)
Chronic, waxes and wanes; plan to cont lasix 30 mg daily and prn leg wraps

## 2016-09-03 NOTE — Assessment & Plan Note (Signed)
Per nursing pt sundown at about 4pm and her personality completely changes; will cont depakote 125 mg BID and I have started ativan scheduled for 4p daily; will monitor

## 2016-09-03 NOTE — Assessment & Plan Note (Signed)
Stable and chronic on senna -s 2 po daily;plan to cont current meds

## 2016-09-26 ENCOUNTER — Encounter: Payer: Self-pay | Admitting: Internal Medicine

## 2016-09-26 ENCOUNTER — Non-Acute Institutional Stay (SKILLED_NURSING_FACILITY): Payer: Medicare HMO | Admitting: Internal Medicine

## 2016-09-26 DIAGNOSIS — A498 Other bacterial infections of unspecified site: Secondary | ICD-10-CM

## 2016-09-26 DIAGNOSIS — Z1612 Extended spectrum beta lactamase (ESBL) resistance: Secondary | ICD-10-CM

## 2016-09-26 DIAGNOSIS — N390 Urinary tract infection, site not specified: Secondary | ICD-10-CM

## 2016-09-26 NOTE — Progress Notes (Signed)
Location:  [facility] Room Number: 319P Place of Service:  SNF [phone]  Randon Goldsmithnne D. Lyn HollingsheadAlexander, MD  Patient Care Team: Brooke BonitoWarren Gallemore, MD as PCP - General (Internal Medicine)  Extended Emergency Contact Information Primary Emergency Contact: Simmons,Tanya Address: [address]          [place], [place] [address] [place] Home Phone: [phone] Mobile Phone: [phone] Relation: Grandaughter Secondary Emergency Contact: Lenard GallowaySimmons,Patricia Ann Address: [address]          [place], [place] [address] [place] Home Phone: [phone] Relation: Daughter    Allergies: Hydrocodone and Prednisone  Chief Complaint  Patient presents with  . Acute Visit    Acute    HPI: Patient is 83 y.o. female who is being seen for ESBL E. Coli UTI. Urine had been ordered for pt having a mental status change. There has ben no fever. Pt with dementia so no further ROS is possible.  Past Medical History:  Diagnosis Date  . Dementia   . DNR (do not resuscitate)   . Hypertension   . Overactive bladder     Past Surgical History:  Procedure Laterality Date  . ADENOIDECTOMY    . bilateral cataract surgery    . FOOT SURGERY    . FRACTURE SURGERY    . JOINT REPLACEMENT    . KNEE SURGERY    . TONSILLECTOMY      Allergies as of 09/26/2016      Reactions   Hydrocodone    Prednisone       Medication List       Accurate as of 09/26/16  3:27 PM. Always use your most recent med list.          acetaminophen 325 MG tablet Commonly known as:  TYLENOL Take 2 tablets (650 mg) by mouth every 6 hours as needed for pain. Also take 2 tablets by mouth (650 mg) at bedtime. Do not exceed 4000 mg APAP in 24 hours.   busPIRone 10 MG tablet Commonly known as:  BUSPAR Take 10 mg by mouth 2 (two) times daily.   calcium-vitamin D 500-200 MG-UNIT tablet Commonly known as:  OSCAL WITH D Take 1 tablet by mouth daily.   CENTRUM SILVER PO Take 1  tablet by mouth daily.   divalproex 125 MG DR tablet Commonly known as:  DEPAKOTE Take 125 mg by mouth 2 (two) times daily.   docusate 50 MG/5ML liquid Commonly known as:  COLACE Take 5 ml by mouth daily.   escitalopram 10 MG tablet Commonly known as:  LEXAPRO Take 15 mg by mouth daily.   feeding supplement (PRO-STAT SUGAR FREE 64) Liqd Take 30 mLs by mouth daily.   furosemide 20 MG tablet Commonly known as:  LASIX Take 30 mg by mouth daily. Give 1.5 tablets by mouth daily.   losartan 50 MG tablet Commonly known as:  COZAAR Take 50 mg by mouth daily.   rivastigmine 9.5 mg/24hr Commonly known as:  EXELON Place 1 patch (9.5 mg total) onto the skin daily.   senna-docusate 8.6-50 MG tablet Commonly known as:  Senokot-S Take 2 tablets by mouth daily.   UNABLE TO FIND Med Name: Ativan Gel  0.5 mg/ml : give 1 ml topically every evening at 4 pm for agitation   UNABLE TO FIND Med Name: Med Pass intiate 4 oz three times a day   UNABLE TO FIND Med Name: Magic Cup : give with lunch and  dinner trays due to weight loss.   UTI-STAT PO Take 30 mLs by mouth 2 (two) times daily.   Vitamin D 2000 units tablet Take 2,000 Units by mouth daily.       Meds ordered this encounter  Medications  . UNABLE TO FIND    Sig: Med Name: Ativan Gel  0.5 mg/ml : give 1 ml topically every evening at 4 pm for agitation    Immunization History  Administered Date(s) Administered  . Influenza-Unspecified 07/06/2014, 06/18/2015  . Tdap 04/10/2012    Social History  Substance Use Topics  . Smoking status: Never Smoker  . Smokeless tobacco: Never Used  . Alcohol use No    Review of System UTO 2/2 dementia  Vitals:   09/26/16 1519  BP: (!) 113/55  Pulse: 77  Resp: 18  Temp: 98.6 F (37 C)   Body mass index is 23.77 kg/m. Physical Exam  GENERAL APPEARANCE: Alert, mod conversant, No acute distress , lying  bed SKIN: No diaphoresis rash HEENT: Unremarkable RESPIRATORY:  Breathing is even, unlabored. Lung sounds are clear   CARDIOVASCULAR: Heart RRR 3/6 murmur, norubs or gallops. No peripheral edema  GASTROINTESTINAL: Abdomen is soft, non-tender, not distended w/ normal bowel sounds.  GENITOURINARY: Bladder non tender, not distended  MUSCULOSKELETAL: No abnormal joints or musculature NEUROLOGIC: Cranial nerves 2-12 grossly intact. Moves all extremities PSYCHIATRIC: dementia, no behavioral issues  Patient Active Problem List   Diagnosis Date Noted  . Mood disorder (HCC) 06/11/2016  . Bilateral lower extremity edema 06/11/2016  . Vitamin D deficiency 05/09/2016  . Anxiety 05/09/2016  . Infection due to ESBL-producing Escherichia coli 02/11/2016  . Elbow pain 01/27/2016  . Cerumen impaction 12/30/2015  . Contusion 12/20/2015  . UTI (urinary tract infection) 06/07/2015  . Hordeolum internum of right lower eyelid 02/03/2015  . Osteoporosis 06/10/2014  . Hyperglycemia 03/06/2014  . Dermatitis 03/06/2014  . Chronic venous insufficiency 12/08/2013  . Altered mental status 10/20/2013  . Complicated UTI (urinary tract infection) 05/01/2013  . Edema 04/02/2013  . Dysphagia 04/02/2013  . Depression 02/25/2013  . Constipation 02/25/2013  . Essential hypertension, benign 02/25/2013  . Alzheimer's disease 02/25/2013    CMP     Component Value Date/Time   NA 139 06/09/2016   K 4.2 06/09/2016   CL 94 (L) 06/02/2012 0950   CO2 31 06/02/2012 0950   GLUCOSE 93 06/02/2012 0950   BUN 18 06/09/2016   CREATININE 0.7 06/09/2016   CREATININE 0.60 06/02/2012 0950   CALCIUM 9.4 06/02/2012 0950   PROT 6.4 06/02/2012 0950   ALBUMIN 3.6 06/02/2012 0950   AST 20 06/09/2016   ALT 9 06/09/2016   ALKPHOS 58 06/09/2016   BILITOT 0.5 06/02/2012 0950   GFRNONAA 80 (L) 06/02/2012 0950   GFRAA >90 06/02/2012 0950    Recent Labs  10/05/15 12/21/15 06/09/16  NA 139 140 139  K 4.2 4.2 4.2  BUN 21 21 18   CREATININE 0.7 0.8 0.7    Recent Labs  10/05/15 06/09/16    AST 19 20  ALT 10 9  ALKPHOS 51 58    Recent Labs  10/05/15 12/21/15 06/09/16  WBC 5.9 8.9 8.1  HGB 12.5 13.0 14.1  HCT 37 39 42  PLT 187 211 241    Recent Labs  06/09/16  CHOL 195  LDLCALC 143  TRIG 71   Lab Results  Component Value Date   MICROALBUR 82 01/31/2016   MICROALBUR 4.7 01/31/2016   Lab Results  Component Value Date  TSH 0.92 06/09/2016   Lab Results  Component Value Date   HGBA1C 5.3 06/09/2016   Lab Results  Component Value Date   CHOL 195 06/09/2016   HDL 38 06/09/2016   LDLCALC 143 06/09/2016   TRIG 71 06/09/2016    Significant Diagnostic Results in last 30 days:  No results found.  Assessment and Plan  ESBL E. Coli UTI - pt was started on Septra DS BID for 10 days by the on call ; MIC is 20 which is not good but nursing has said pt is acting better; will cont septra DS for 10 days then do a repeat urine the day after the antibiotics are done     Time spent > 25 min;> 50% of time with patient was spent reviewing records, labs, tests and studies, counseling and developing plan of care  Thurston Hole D. Lyn Hollingshead, MD

## 2016-10-01 ENCOUNTER — Encounter: Payer: Self-pay | Admitting: Internal Medicine

## 2016-10-11 ENCOUNTER — Encounter: Payer: Self-pay | Admitting: Internal Medicine

## 2016-10-11 ENCOUNTER — Non-Acute Institutional Stay (SKILLED_NURSING_FACILITY): Payer: Medicare HMO | Admitting: Internal Medicine

## 2016-10-11 DIAGNOSIS — F419 Anxiety disorder, unspecified: Secondary | ICD-10-CM | POA: Diagnosis not present

## 2016-10-11 DIAGNOSIS — G308 Other Alzheimer's disease: Secondary | ICD-10-CM

## 2016-10-11 DIAGNOSIS — F0281 Dementia in other diseases classified elsewhere with behavioral disturbance: Secondary | ICD-10-CM | POA: Diagnosis not present

## 2016-10-11 DIAGNOSIS — F32A Depression, unspecified: Secondary | ICD-10-CM

## 2016-10-11 DIAGNOSIS — F329 Major depressive disorder, single episode, unspecified: Secondary | ICD-10-CM | POA: Diagnosis not present

## 2016-10-11 DIAGNOSIS — G309 Alzheimer's disease, unspecified: Principal | ICD-10-CM

## 2016-10-11 DIAGNOSIS — R69 Illness, unspecified: Secondary | ICD-10-CM | POA: Diagnosis not present

## 2016-10-11 NOTE — Progress Notes (Signed)
Location:  [facility]  Place of Service:  SNF (31)  Randon Goldsmithnne D.  Lyn HollingsheadAlexander, MD  Patient Care Team: Brooke BonitoWarren Gallemore, MD as PCP - General (Internal Medicine)  Extended Emergency Contact Information Primary Emergency Contact: Simmons,Tanya Address: [address]          [place], [place] [address] [place] Home Phone: [phone] Mobile Phone: [phone] Relation: Grandaughter Secondary Emergency Contact: Lenard GallowaySimmons,Patricia Ann Address: [address]          [place], [place] [address] [place] Home Phone: [phone] Relation: Daughter    Allergies: Hydrocodone and Prednisone  Chief Complaint  Patient presents with  . Medical Management of Chronic Issues    Routine Visit    HPI: Patient is 83 y.o. female who is being seen for routine issues of alzheimers, anxiety and depression.  Past Medical History:  Diagnosis Date  . Dementia   . DNR (do not resuscitate)   . Hypertension   . Overactive bladder     Past Surgical History:  Procedure Laterality Date  . ADENOIDECTOMY    . bilateral cataract surgery    . FOOT SURGERY    . FRACTURE SURGERY    . JOINT REPLACEMENT    . KNEE SURGERY    . TONSILLECTOMY      Allergies as of 10/11/2016      Reactions   Hydrocodone    Prednisone       Medication List       Accurate as of 10/11/16 11:59 PM. Always use your most recent med list.          acetaminophen 325 MG tablet Commonly known as:  TYLENOL Take 2 tablets (650 mg) by mouth every 6 hours as needed for pain. Also take 2 tablets by mouth (650 mg) at bedtime. Do not exceed 4000 mg APAP in 24 hours.   busPIRone 10 MG tablet Commonly known as:  BUSPAR Take 10 mg by mouth 2 (two) times daily.   calcium-vitamin D 500-200 MG-UNIT tablet Commonly known as:  OSCAL WITH D Take 1 tablet by mouth daily.   CENTRUM SILVER PO Take 1 tablet by mouth daily.   divalproex 125 MG DR tablet Commonly known as:  DEPAKOTE Take  125 mg by mouth 2 (two) times daily.   docusate 50 MG/5ML liquid Commonly known as:  COLACE Take 5 ml by mouth daily.   escitalopram 10 MG tablet Commonly known as:  LEXAPRO Take 15 mg by mouth daily.   feeding supplement (PRO-STAT SUGAR FREE 64) Liqd Take 30 mLs by mouth daily.   furosemide 20 MG tablet Commonly known as:  LASIX Take 30 mg by mouth daily. Give 1.5 tablets by mouth daily.   losartan 50 MG tablet Commonly known as:  COZAAR Take 50 mg by mouth daily.   rivastigmine 9.5 mg/24hr Commonly known as:  EXELON Place 1 patch (9.5 mg total) onto the skin daily.   senna-docusate 8.6-50 MG tablet Commonly known as:  Senokot-S Take 2 tablets by mouth daily.   UNABLE TO FIND Med Name: Ativan Gel  0.5 mg/ml : give 1 ml topically every evening at 4 pm for agitation   UNABLE TO FIND Med Name: Med Pass intiate 4 oz three times a day   UNABLE TO FIND Med Name: Magic Cup : give with lunch and dinner trays due to weight loss.   UTI-STAT PO Take 30 mLs by mouth 2 (two) times daily.   Vitamin  D 2000 units tablet Take 2,000 Units by mouth daily.       No orders of the defined types were placed in this encounter.   Immunization History  Administered Date(s) Administered  . Influenza-Unspecified 07/06/2014, 06/18/2015  . Tdap 04/10/2012    Social History  Substance Use Topics  . Smoking status: Never Smoker  . Smokeless tobacco: Never Used  . Alcohol use No    Review of Systems  UTO 2/2 dementia; nursing without concerns     Vitals:   10/11/16 0931  BP: 104/79  Pulse: 67  Resp: 20  Temp: 98.3 F (36.8 C)   Body mass index is 23.56 kg/m. Physical Exam  GENERAL APPEARANCE: Alert, conversant, No acute distress  SKIN: No diaphoresis rash HEENT: Unremarkable RESPIRATORY: Breathing is even, unlabored. Lung sounds are clear   CARDIOVASCULAR: Heart RRR no murmurs, rubs or gallops. No peripheral edema  GASTROINTESTINAL: Abdomen is soft, non-tender,  not distended w/ normal bowel sounds.  GENITOURINARY: Bladder non tender, not distended  MUSCULOSKELETAL: No abnormal joints or musculature NEUROLOGIC: Cranial nerves 2-12 grossly intact. Moves all extremities PSYCHIATRIC: dementia, no behavioral issues  Patient Active Problem List   Diagnosis Date Noted  . Mood disorder (HCC) 06/11/2016  . Bilateral lower extremity edema 06/11/2016  . Vitamin D deficiency 05/09/2016  . Anxiety 05/09/2016  . Infection due to ESBL-producing Escherichia coli 02/11/2016  . Elbow pain 01/27/2016  . Cerumen impaction 12/30/2015  . Contusion 12/20/2015  . UTI (urinary tract infection) 06/07/2015  . Hordeolum internum of right lower eyelid 02/03/2015  . Osteoporosis 06/10/2014  . Hyperglycemia 03/06/2014  . Dermatitis 03/06/2014  . Chronic venous insufficiency 12/08/2013  . Altered mental status 10/20/2013  . Complicated UTI (urinary tract infection) 05/01/2013  . Edema 04/02/2013  . Dysphagia 04/02/2013  . Depression 02/25/2013  . Constipation 02/25/2013  . Essential hypertension, benign 02/25/2013  . Alzheimer's disease 02/25/2013    CMP     Component Value Date/Time   NA 139 06/09/2016   K 4.2 06/09/2016   CL 94 (L) 06/02/2012 0950   CO2 31 06/02/2012 0950   GLUCOSE 93 06/02/2012 0950   BUN 18 06/09/2016   CREATININE 0.7 06/09/2016   CREATININE 0.60 06/02/2012 0950   CALCIUM 9.4 06/02/2012 0950   PROT 6.4 06/02/2012 0950   ALBUMIN 3.6 06/02/2012 0950   AST 20 06/09/2016   ALT 9 06/09/2016   ALKPHOS 58 06/09/2016   BILITOT 0.5 06/02/2012 0950   GFRNONAA 80 (L) 06/02/2012 0950   GFRAA >90 06/02/2012 0950    Recent Labs  12/21/15 06/09/16  NA 140 139  K 4.2 4.2  BUN 21 18  CREATININE 0.8 0.7    Recent Labs  06/09/16  AST 20  ALT 9  ALKPHOS 58    Recent Labs  12/21/15 06/09/16  WBC 8.9 8.1  HGB 13.0 14.1  HCT 39 42  PLT 211 241    Recent Labs  06/09/16  CHOL 195  LDLCALC 143  TRIG 71   Lab Results  Component  Value Date   MICROALBUR 82 01/31/2016   MICROALBUR 4.7 01/31/2016   Lab Results  Component Value Date   TSH 0.92 06/09/2016   Lab Results  Component Value Date   HGBA1C 5.3 06/09/2016   Lab Results  Component Value Date   CHOL 195 06/09/2016   HDL 38 06/09/2016   LDLCALC 143 06/09/2016   TRIG 71 06/09/2016    Significant Diagnostic Results in last 30 days:  No results  found.  Assessment and Plan  Alzheimer's disease Very pleasantly demented with no major changes; plan to cont exelon 9.5 mg patch daily  Anxiety Cont buspar 10 mg TID, pt rarely appears distressed  Depression Chronic and well controlled ;plan to cont lexapro 15 mg daily    Horacio Werth D. Lyn Hollingshead, MD

## 2016-10-19 ENCOUNTER — Non-Acute Institutional Stay (SKILLED_NURSING_FACILITY): Payer: Medicare HMO | Admitting: Internal Medicine

## 2016-10-19 DIAGNOSIS — R0989 Other specified symptoms and signs involving the circulatory and respiratory systems: Secondary | ICD-10-CM | POA: Diagnosis not present

## 2016-10-19 DIAGNOSIS — R6889 Other general symptoms and signs: Secondary | ICD-10-CM

## 2016-10-19 DIAGNOSIS — Z20828 Contact with and (suspected) exposure to other viral communicable diseases: Secondary | ICD-10-CM

## 2016-10-20 DIAGNOSIS — J111 Influenza due to unidentified influenza virus with other respiratory manifestations: Secondary | ICD-10-CM | POA: Diagnosis not present

## 2016-10-26 DIAGNOSIS — F39 Unspecified mood [affective] disorder: Secondary | ICD-10-CM | POA: Diagnosis not present

## 2016-10-26 DIAGNOSIS — F329 Major depressive disorder, single episode, unspecified: Secondary | ICD-10-CM | POA: Diagnosis not present

## 2016-10-26 DIAGNOSIS — R69 Illness, unspecified: Secondary | ICD-10-CM | POA: Diagnosis not present

## 2016-10-26 DIAGNOSIS — F411 Generalized anxiety disorder: Secondary | ICD-10-CM | POA: Diagnosis not present

## 2016-10-28 ENCOUNTER — Encounter: Payer: Self-pay | Admitting: Internal Medicine

## 2016-10-28 NOTE — Assessment & Plan Note (Signed)
Very pleasantly demented with no major changes; plan to cont exelon 9.5 mg patch daily

## 2016-10-28 NOTE — Assessment & Plan Note (Signed)
Chronic and well controlled ;plan to cont lexapro 15 mg daily

## 2016-10-28 NOTE — Assessment & Plan Note (Signed)
Cont buspar 10 mg TID, pt rarely appears distressed

## 2016-11-08 DIAGNOSIS — N390 Urinary tract infection, site not specified: Secondary | ICD-10-CM | POA: Diagnosis not present

## 2016-11-09 ENCOUNTER — Encounter: Payer: Self-pay | Admitting: Internal Medicine

## 2016-11-09 ENCOUNTER — Non-Acute Institutional Stay (SKILLED_NURSING_FACILITY): Payer: Medicare HMO | Admitting: Internal Medicine

## 2016-11-09 DIAGNOSIS — I10 Essential (primary) hypertension: Secondary | ICD-10-CM

## 2016-11-09 DIAGNOSIS — R69 Illness, unspecified: Secondary | ICD-10-CM | POA: Diagnosis not present

## 2016-11-09 DIAGNOSIS — M810 Age-related osteoporosis without current pathological fracture: Secondary | ICD-10-CM

## 2016-11-09 DIAGNOSIS — F39 Unspecified mood [affective] disorder: Secondary | ICD-10-CM | POA: Diagnosis not present

## 2016-11-09 NOTE — Progress Notes (Signed)
Location:  [facility] Room Number: 319P Place of Service:  SNF [phone])  Randon Goldsmith. Lyn Hollingshead, MD  Patient Care Team: Brooke Bonito, MD as PCP - General (Internal Medicine)  Extended Emergency Contact Information Primary Emergency Contact: Simmons,Tanya Address: [address]          [place], [place] [address] [place] Home Phone: [phone] Mobile Phone: [phone] Relation: Grandaughter Secondary Emergency Contact: Lenard Galloway Address: [address]          [place], [place] [address] [place] Home Phone: [phone] Relation: Daughter    Allergies: Hydrocodone and Prednisone  Chief Complaint  Patient presents with  . Medical Management of Chronic Issues    Routine Visit    HPI: Patient is 83 y.o. female who is being seen for routine issues of osteoporosis, mood disorder and HTN.  Past Medical History:  Diagnosis Date  . Dementia   . DNR (do not resuscitate)   . Hypertension   . Overactive bladder     Past Surgical History:  Procedure Laterality Date  . ADENOIDECTOMY    . bilateral cataract surgery    . FOOT SURGERY    . FRACTURE SURGERY    . JOINT REPLACEMENT    . KNEE SURGERY    . TONSILLECTOMY      Allergies as of 11/09/2016      Reactions   Hydrocodone    Prednisone       Medication List       Accurate as of 11/09/16 11:59 PM. Always use your most recent med list.          acetaminophen 325 MG tablet Commonly known as:  TYLENOL Take 2 tablets (650 mg) by mouth every 6 hours as needed for pain. Also take 2 tablets by mouth (650 mg) at bedtime. Do not exceed 4000 mg APAP in 24 hours.   busPIRone 10 MG tablet Commonly known as:  BUSPAR Take 10 mg by mouth 2 (two) times daily.   calcium-vitamin D 500-200 MG-UNIT tablet Commonly known as:  OSCAL WITH D Take 1 tablet by mouth daily.   CENTRUM SILVER PO Take 1 tablet by mouth daily.   divalproex 125 MG DR  tablet Commonly known as:  DEPAKOTE Take 125 mg by mouth 2 (two) times daily.   docusate 50 MG/5ML liquid Commonly known as:  COLACE Take 5 ml by mouth daily.   escitalopram 10 MG tablet Commonly known as:  LEXAPRO Take 10 mg by mouth daily.   feeding supplement (PRO-STAT SUGAR FREE 64) Liqd Take 30 mLs by mouth daily.   furosemide 20 MG tablet Commonly known as:  LASIX Take 30 mg by mouth daily. Give 1.5 tablets by mouth daily.   losartan 50 MG tablet Commonly known as:  COZAAR Take 50 mg by mouth daily.   rivastigmine 9.5 mg/24hr Commonly known as:  EXELON Place 1 patch (9.5 mg total) onto the skin daily.   senna-docusate 8.6-50 MG tablet Commonly known as:  Senokot-S Take 2 tablets by mouth daily.   UNABLE TO FIND Med Name: Ativan Gel  0.5 mg/ml : give 1 ml topically every evening at 4 pm for agitation   UNABLE TO FIND Med Name: Med Pass intiate 4 oz three times a day   UNABLE TO FIND Med Name: Magic Cup : give with lunch and dinner trays due to weight loss.   UTI-STAT PO Take 30 mLs by mouth 2 (two) times  daily.   Vitamin D 2000 units tablet Take 2,000 Units by mouth daily.       No orders of the defined types were placed in this encounter.   Immunization History  Administered Date(s) Administered  . Influenza-Unspecified 07/06/2014, 06/18/2015  . Tdap 04/10/2012    Social History  Substance Use Topics  . Smoking status: Never Smoker  . Smokeless tobacco: Never Used  . Alcohol use No    Review of Systems  UTO 22/ dementia    Vitals:   11/09/16 1150  BP: (!) 111/54  Pulse: (!) 57  Resp: 18  Temp: 98.3 F (36.8 C)   Body mass index is 23.51 kg/m. Physical Exam  GENERAL APPEARANCE: Alert, conversant, No acute distress  SKIN: No diaphoresis rash HEENT: Unremarkable RESPIRATORY: Breathing is even, unlabored. Lung sounds are clear   CARDIOVASCULAR: Heart RRR no murmurs, rubs or gallops. No peripheral edema  GASTROINTESTINAL: Abdomen  is soft, non-tender, not distended w/ normal bowel sounds.  GENITOURINARY: Bladder non tender, not distended  MUSCULOSKELETAL: No abnormal joints or musculature NEUROLOGIC: Cranial nerves 2-12 grossly intact. Moves all extremities PSYCHIATRIC: dementia, no behavioral issues  Patient Active Problem List   Diagnosis Date Noted  . Mood disorder (HCC) 06/11/2016  . Bilateral lower extremity edema 06/11/2016  . Vitamin D deficiency 05/09/2016  . Anxiety 05/09/2016  . Infection due to ESBL-producing Escherichia coli 02/11/2016  . Elbow pain 01/27/2016  . Cerumen impaction 12/30/2015  . Contusion 12/20/2015  . UTI (urinary tract infection) 06/07/2015  . Hordeolum internum of right lower eyelid 02/03/2015  . Osteoporosis 06/10/2014  . Hyperglycemia 03/06/2014  . Dermatitis 03/06/2014  . Chronic venous insufficiency 12/08/2013  . Altered mental status 10/20/2013  . Complicated UTI (urinary tract infection) 05/01/2013  . Edema 04/02/2013  . Dysphagia 04/02/2013  . Depression 02/25/2013  . Constipation 02/25/2013  . Essential hypertension, benign 02/25/2013  . Alzheimer's disease 02/25/2013    CMP     Component Value Date/Time   NA 139 06/09/2016   K 4.2 06/09/2016   CL 94 (L) 06/02/2012 0950   CO2 31 06/02/2012 0950   GLUCOSE 93 06/02/2012 0950   BUN 18 06/09/2016   CREATININE 0.7 06/09/2016   CREATININE 0.60 06/02/2012 0950   CALCIUM 9.4 06/02/2012 0950   PROT 6.4 06/02/2012 0950   ALBUMIN 3.6 06/02/2012 0950   AST 20 06/09/2016   ALT 9 06/09/2016   ALKPHOS 58 06/09/2016   BILITOT 0.5 06/02/2012 0950   GFRNONAA 80 (L) 06/02/2012 0950   GFRAA >90 06/02/2012 0950    Recent Labs  12/21/15 06/09/16  NA 140 139  K 4.2 4.2  BUN 21 18  CREATININE 0.8 0.7    Recent Labs  06/09/16  AST 20  ALT 9  ALKPHOS 58    Recent Labs  12/21/15 06/09/16  WBC 8.9 8.1  HGB 13.0 14.1  HCT 39 42  PLT 211 241    Recent Labs  06/09/16  CHOL 195  LDLCALC 143  TRIG 71    Lab Results  Component Value Date   MICROALBUR 82 01/31/2016   MICROALBUR 4.7 01/31/2016   Lab Results  Component Value Date   TSH 0.92 06/09/2016   Lab Results  Component Value Date   HGBA1C 5.3 06/09/2016   Lab Results  Component Value Date   CHOL 195 06/09/2016   HDL 38 06/09/2016   LDLCALC 143 06/09/2016   TRIG 71 06/09/2016    Significant Diagnostic Results in last 30 days:  No results found.  Assessment and Plan  Osteoporosis Chronic stable; plan to cont oscal + d 500 + 200 1 po daily  Mood disorder (HCC) Mostly controlled ; cont Depakote 125 mg BID  Essential hypertension, benign Well controlled ; cont lasix 30 mg daily and losartan 50 mg daily    Trinaty Bundrick D. Lyn Hollingshead, MD

## 2016-11-14 DIAGNOSIS — Z961 Presence of intraocular lens: Secondary | ICD-10-CM | POA: Diagnosis not present

## 2016-11-14 DIAGNOSIS — H04123 Dry eye syndrome of bilateral lacrimal glands: Secondary | ICD-10-CM | POA: Diagnosis not present

## 2016-11-14 DIAGNOSIS — E119 Type 2 diabetes mellitus without complications: Secondary | ICD-10-CM | POA: Diagnosis not present

## 2016-11-18 DIAGNOSIS — R319 Hematuria, unspecified: Secondary | ICD-10-CM | POA: Diagnosis not present

## 2016-11-18 DIAGNOSIS — N390 Urinary tract infection, site not specified: Secondary | ICD-10-CM | POA: Diagnosis not present

## 2016-11-18 DIAGNOSIS — I129 Hypertensive chronic kidney disease with stage 1 through stage 4 chronic kidney disease, or unspecified chronic kidney disease: Secondary | ICD-10-CM | POA: Diagnosis not present

## 2016-11-18 LAB — CBC AND DIFFERENTIAL
HCT: 40 % (ref 36–46)
HEMOGLOBIN: 13.5 g/dL (ref 12.0–16.0)
Platelets: 193 10*3/uL (ref 150–399)
WBC: 6.6 10*3/mL

## 2016-11-25 ENCOUNTER — Encounter: Payer: Self-pay | Admitting: Internal Medicine

## 2016-11-25 NOTE — Assessment & Plan Note (Signed)
Mostly controlled ; cont Depakote 125 mg BID

## 2016-11-25 NOTE — Assessment & Plan Note (Signed)
Chronic stable; plan to cont oscal + d 500 + 200 1 po daily

## 2016-11-25 NOTE — Assessment & Plan Note (Signed)
Well controlled ; cont lasix 30 mg daily and losartan 50 mg daily

## 2016-11-29 ENCOUNTER — Encounter: Payer: Self-pay | Admitting: Internal Medicine

## 2016-11-29 NOTE — Progress Notes (Signed)
Location:  [facility] Room Number: 319P Place of Service:  SNF [phone])  Sandra Holland. Sandra Hollingshead, MD  Patient Care Team: Margit Hanks, MD as PCP - General (Internal Medicine)  Extended Emergency Contact Information Primary Emergency Contact: Simmons,Tanya Address: [address]          [place], [place] [address] [place] Home Phone: [phone] Mobile Phone: [phone] Relation: Grandaughter Secondary Emergency Contact: Lenard Galloway Address: [address]          [place], [place] [address] [place] Home Phone: [phone] Relation: Daughter    Allergies: Hydrocodone and Prednisone  Chief Complaint  Patient presents with  . Acute Visit    HPI: Patient is 83 y.o. female who is being seen acutely because an outbreak of Influenza A per CDC guidelines was recognized on 10/18/2016. Pt is presenting with flu-like sx today, runny nose, cough and fever 99.9.  Past Medical History:  Diagnosis Date  . Dementia   . DNR (do not resuscitate)   . Hypertension   . Overactive bladder     Past Surgical History:  Procedure Laterality Date  . ADENOIDECTOMY    . bilateral cataract surgery    . FOOT SURGERY    . FRACTURE SURGERY    . JOINT REPLACEMENT    . KNEE SURGERY    . TONSILLECTOMY      Allergies as of 10/19/2016      Reactions   Hydrocodone    Prednisone       Medication List       Accurate as of 10/19/16 11:59 PM. Always use your most recent med list.          acetaminophen 325 MG tablet Commonly known as:  TYLENOL Take 2 tablets (650 mg) by mouth every 6 hours as needed for pain. Also take 2 tablets by mouth (650 mg) at bedtime. Do not exceed 4000 mg APAP in 24 hours.   busPIRone 10 MG tablet Commonly known as:  BUSPAR Take 10 mg by mouth 2 (two) times daily.   calcium-vitamin D 500-200 MG-UNIT tablet Commonly known as:  OSCAL WITH D Take 1 tablet by mouth daily.   carboxymethylcellulose 1 %  ophthalmic solution Place 1 drop into both eyes 2 (two) times daily.   CENTRUM SILVER PO Take 1 tablet by mouth daily.   divalproex 125 MG DR tablet Commonly known as:  DEPAKOTE Take 125 mg by mouth 2 (two) times daily.   docusate 50 MG/5ML liquid Commonly known as:  COLACE Take 5 ml by mouth daily.   escitalopram 10 MG tablet Commonly known as:  LEXAPRO Take 10 mg by mouth daily.   feeding supplement (PRO-STAT SUGAR FREE 64) Liqd Take 30 mLs by mouth daily.   furosemide 20 MG tablet Commonly known as:  LASIX Take 30 mg by mouth daily. Give 1.5 tablets by mouth daily.   losartan 50 MG tablet Commonly known as:  COZAAR Take 50 mg by mouth daily.   rivastigmine 9.5 mg/24hr Commonly known as:  EXELON Place 1 patch (9.5 mg total) onto the skin daily.   senna-docusate 8.6-50 MG tablet Commonly known as:  Senokot-S Take 2 tablets by mouth daily.   UNABLE TO FIND Med Name: Ativan Gel  0.5 mg/ml : give 1 ml topically every evening at 4 pm for agitation   UNABLE TO FIND Med Name: Med Pass intiate 4 oz three times a day   UNABLE TO  FIND Med Name: Magic Cup : give with lunch and dinner trays due to weight loss.   UTI-STAT PO Take 30 mLs by mouth 2 (two) times daily.   Vitamin D 2000 units tablet Take 2,000 Units by mouth daily.       No orders of the defined types were placed in this encounter.   Immunization History  Administered Date(s) Administered  . Influenza-Unspecified 07/06/2014, 06/18/2015  . Tdap 04/10/2012    Social History  Substance Use Topics  . Smoking status: Never Smoker  . Smokeless tobacco: Never Used  . Alcohol use No    Review of Systems  DATA OBTAINED: from nurse GENERAL:  no fevers SKIN: No itching, rash HEENT: + rhinorrhea, congestion, no ST or ear pain RESPIRATORY: + cough,- wheezing,- SOB CARDIAC: No chest pain, palpitations, lower extremity edema  GI: No abdominal pain, No N/V/D or constipation, No heartburn or reflux    MUSCULOSKELETAL: No muscle aches NEUROLOGIC: No headache, dizziness   Vitals:   10/19/16 1624  BP: (!) 120/56  Pulse: 71  Resp: 16  Temp: 98.2 F (36.8 C)   Body mass index is 23.51 kg/m. Physical Exam  GENERAL APPEARANCE: Alert, conversant, No acute distress  SKIN: No diaphoresis rash HEENT: Unremarkable RESPIRATORY: Breathing is even, unlabored. Lung sounds are slt rhonchi  CARDIOVASCULAR: Heart RRR no murmurs, rubs or gallops. No peripheral edema  GASTROINTESTINAL: Abdomen is soft, non-tender, not distended w/ normal bowel sounds.   NEUROLOGIC: Cranial nerves 2-12 grossly intact PSYCHIATRIC: baseline, no mental status changes  Patient Active Problem List   Diagnosis Date Noted  . Mood disorder (HCC) 06/11/2016  . Bilateral lower extremity edema 06/11/2016  . Vitamin D deficiency 05/09/2016  . Anxiety 05/09/2016  . Infection due to ESBL-producing Escherichia coli 02/11/2016  . Elbow pain 01/27/2016  . Cerumen impaction 12/30/2015  . Contusion 12/20/2015  . UTI (urinary tract infection) 06/07/2015  . Hordeolum internum of right lower eyelid 02/03/2015  . Osteoporosis 06/10/2014  . Hyperglycemia 03/06/2014  . Dermatitis 03/06/2014  . Chronic venous insufficiency 12/08/2013  . Altered mental status 10/20/2013  . Complicated UTI (urinary tract infection) 05/01/2013  . Edema 04/02/2013  . Dysphagia 04/02/2013  . Depression 02/25/2013  . Constipation 02/25/2013  . Essential hypertension, benign 02/25/2013  . Alzheimer's disease 02/25/2013    CMP     Component Value Date/Time   NA 143 11/30/2016   K 5.1 11/30/2016   CL 94 (L) 06/02/2012 0950   CO2 31 06/02/2012 0950   GLUCOSE 93 06/02/2012 0950   BUN 18 11/30/2016   CREATININE 0.5 11/30/2016   CREATININE 0.60 06/02/2012 0950   CALCIUM 9.4 06/02/2012 0950   PROT 6.4 06/02/2012 0950   ALBUMIN 3.6 06/02/2012 0950   AST 15 11/30/2016   ALT 7 11/30/2016   ALKPHOS 50 11/30/2016   BILITOT 0.5 06/02/2012 0950    GFRNONAA 80 (L) 06/02/2012 0950   GFRAA >90 06/02/2012 0950    Recent Labs  12/21/15 06/09/16 11/30/16  NA 140 139 143  K 4.2 4.2 5.1  BUN 21 18 18   CREATININE 0.8 0.7 0.5    Recent Labs  06/09/16 11/30/16  AST 20 15  ALT 9 7  ALKPHOS 58 50    Recent Labs  12/21/15 06/09/16 11/30/16  WBC 8.9 8.1 6.9  HGB 13.0 14.1 14.3  HCT 39 42 43  PLT 211 241 197    Recent Labs  06/09/16 11/30/16  CHOL 195 181  LDLCALC 143 127  TRIG 71  65   Lab Results  Component Value Date   MICROALBUR 82 01/31/2016   MICROALBUR 4.7 01/31/2016   Lab Results  Component Value Date   TSH 1.31 11/30/2016   Lab Results  Component Value Date   HGBA1C 5.3 11/30/2016   Lab Results  Component Value Date   CHOL 181 11/30/2016   HDL 41 11/30/2016   LDLCALC 127 11/30/2016   TRIG 65 11/30/2016    Significant Diagnostic Results in last 30 days:  No results found.  Assessment and Plan  EXPOSURE TO FLU/ INFLUENZA OUTBREAK AT SNF/FLU LIKE SX - ordering CXR, CBC,BMP and flu test; per CDC protocol will treat as if flu until flu test comes back; therefore tamiflu 30 mg BID for 5 days based on CrCl of 49                                                                                Jhalen Eley D. Sandra HollingsheadAlexander, MD

## 2016-11-30 LAB — LIPID PANEL
Cholesterol: 181 mg/dL (ref 0–200)
HDL: 41 mg/dL (ref 35–70)
LDL CALC: 127 mg/dL
Triglycerides: 65 mg/dL (ref 40–160)

## 2016-11-30 LAB — HEPATIC FUNCTION PANEL
ALK PHOS: 50 U/L (ref 25–125)
ALT: 7 U/L (ref 7–35)
AST: 15 U/L (ref 13–35)
BILIRUBIN, TOTAL: 0.5 mg/dL

## 2016-11-30 LAB — CBC AND DIFFERENTIAL
HCT: 43 % (ref 36–46)
Hemoglobin: 14.3 g/dL (ref 12.0–16.0)
PLATELETS: 197 10*3/uL (ref 150–399)
WBC: 6.9 10^3/mL

## 2016-11-30 LAB — HEMOGLOBIN A1C: Hemoglobin A1C: 5.3

## 2016-11-30 LAB — BASIC METABOLIC PANEL
BUN: 18 mg/dL (ref 4–21)
Creatinine: 0.5 mg/dL (ref 0.5–1.1)
GLUCOSE: 100 mg/dL
Potassium: 5.1 mmol/L (ref 3.4–5.3)
SODIUM: 143 mmol/L (ref 137–147)

## 2016-11-30 LAB — VITAMIN D 25 HYDROXY (VIT D DEFICIENCY, FRACTURES): VIT D 25 HYDROXY: 46.75

## 2016-11-30 LAB — TSH: TSH: 1.31 u[IU]/mL (ref 0.41–5.90)

## 2016-12-06 DIAGNOSIS — I739 Peripheral vascular disease, unspecified: Secondary | ICD-10-CM | POA: Diagnosis not present

## 2016-12-06 DIAGNOSIS — B351 Tinea unguium: Secondary | ICD-10-CM | POA: Diagnosis not present

## 2016-12-06 DIAGNOSIS — M79674 Pain in right toe(s): Secondary | ICD-10-CM | POA: Diagnosis not present

## 2016-12-06 DIAGNOSIS — M79675 Pain in left toe(s): Secondary | ICD-10-CM | POA: Diagnosis not present

## 2016-12-08 ENCOUNTER — Encounter: Payer: Self-pay | Admitting: Internal Medicine

## 2016-12-08 ENCOUNTER — Non-Acute Institutional Stay (SKILLED_NURSING_FACILITY): Payer: Medicare HMO | Admitting: Internal Medicine

## 2016-12-08 DIAGNOSIS — K5901 Slow transit constipation: Secondary | ICD-10-CM | POA: Diagnosis not present

## 2016-12-08 DIAGNOSIS — R600 Localized edema: Secondary | ICD-10-CM

## 2016-12-08 DIAGNOSIS — E559 Vitamin D deficiency, unspecified: Secondary | ICD-10-CM

## 2016-12-08 NOTE — Progress Notes (Signed)
[facility] Room Number: 319P Place of Service:  SNF [phone]  Sandra SeashoreAnne Jencarlo Bonadonna, MD  Patient Care Team: Margit HanksAnne D Sandra Breisch, MD as PCP - General (Internal Medicine)  Extended Emergency Contact Information Primary Emergency Contact: Simmons,Tanya Address: [address]          [place], [place] [address] [place] Home Phone: [phone] Mobile Phone: [phone] Relation: Grandaughter Secondary Emergency Contact: Lenard GallowaySimmons,Patricia Ann Address: [address]          [place], [place] [address] [place] Home Phone: [phone] Relation: Daughter    Allergies: Hydrocodone and Prednisone  Chief Complaint  Patient presents with  . Medical Management of Chronic Issues    Routine Visit    HPI: Patient is 83 y.o. female who is being sen for routine issues of Vit D def, constipation, and BLE edema.  Past Medical History:  Diagnosis Date  . Dementia   . DNR (do not resuscitate)   . Hypertension   . Overactive bladder     Past Surgical History:  Procedure Laterality Date  . ADENOIDECTOMY    . bilateral cataract surgery    . FOOT SURGERY    . FRACTURE SURGERY    . JOINT REPLACEMENT    . KNEE SURGERY    . TONSILLECTOMY      Allergies as of 12/08/2016      Reactions   Hydrocodone    Prednisone       Medication List       Accurate as of 12/08/16 11:59 PM. Always use your most recent med list.          acetaminophen 325 MG tablet Commonly known as:  TYLENOL Take 2 tablets (650 mg) by mouth every 6 hours as needed for pain. Also take 2 tablets by mouth (650 mg) at bedtime. Do not exceed 4000 mg APAP in 24 hours.   busPIRone 10 MG tablet Commonly known as:  BUSPAR Take 10 mg by mouth 2 (two) times daily.   calcium-vitamin D 500-200 MG-UNIT tablet Commonly known as:  OSCAL WITH D Take 1 tablet by mouth daily.   carboxymethylcellulose 1 % ophthalmic solution Place 1 drop into both eyes 2 (two) times  daily.   CENTRUM SILVER PO Take 1 tablet by mouth daily.   divalproex 125 MG DR tablet Commonly known as:  DEPAKOTE Take 125 mg by mouth 2 (two) times daily.   docusate 50 MG/5ML liquid Commonly known as:  COLACE Take 5 ml by mouth daily.   escitalopram 10 MG tablet Commonly known as:  LEXAPRO Take 10 mg by mouth daily.   feeding supplement (PRO-STAT SUGAR FREE 64) Liqd Take 30 mLs by mouth daily.   furosemide 20 MG tablet Commonly known as:  LASIX Take 30 mg by mouth daily. Give 1.5 tablets by mouth daily.   losartan 50 MG tablet Commonly known as:  COZAAR Take 50 mg by mouth daily.   rivastigmine 9.5 mg/24hr Commonly known as:  EXELON Place 1 patch (9.5 mg total) onto the skin daily.   senna-docusate 8.6-50 MG tablet Commonly known as:  Senokot-S Take 2 tablets by mouth daily.   UNABLE TO FIND Med Name: Ativan Gel  0.5 mg/ml : give 1 ml topically every evening at 4 pm for agitation   UNABLE TO FIND Med Name: Med Pass intiate 4 oz three times a day   UNABLE TO FIND Med Name: Magic Cup : give with lunch  and dinner trays due to weight loss.   UTI-STAT PO Take 30 mLs by mouth 2 (two) times daily.   Vitamin D 2000 units tablet Take 2,000 Units by mouth daily.       No orders of the defined types were placed in this encounter.   Immunization History  Administered Date(s) Administered  . Influenza-Unspecified 07/06/2014, 06/18/2015  . Tdap 04/10/2012    Social History  Substance Use Topics  . Smoking status: Never Smoker  . Smokeless tobacco: Never Used  . Alcohol use No    Review of Systems  UTO 2/2 dementia; nursing without concerns    Vitals:   12/08/16 0950  BP: (!) 120/56  Pulse: 71  Resp: 16  Temp: 98.2 F (36.8 C)   Body mass index is 23.95 kg/m. Physical Exam  GENERAL APPEARANCE: Alert, conversant, No acute distress  SKIN: No diaphoresis rash HEENT: Unremarkable RESPIRATORY: Breathing is even, unlabored. Lung sounds are clear    CARDIOVASCULAR: Heart RRR no murmurs, rubs or gallops. + peripheral edema  GASTROINTESTINAL: Abdomen is soft, non-tender, not distended w/ normal bowel sounds.  GENITOURINARY: Bladder non tender, not distended  MUSCULOSKELETAL: No abnormal joints or musculature NEUROLOGIC: Cranial nerves 2-12 grossly intact. Moves all extremities PSYCHIATRIC: Mood and affect appropriate to situation, no behavioral issues  Patient Active Problem List   Diagnosis Date Noted  . Mood disorder (HCC) 06/11/2016  . Bilateral lower extremity edema 06/11/2016  . Vitamin D deficiency 05/09/2016  . Anxiety 05/09/2016  . Infection due to ESBL-producing Escherichia coli 02/11/2016  . Elbow pain 01/27/2016  . Cerumen impaction 12/30/2015  . Contusion 12/20/2015  . UTI (urinary tract infection) 06/07/2015  . Hordeolum internum of right lower eyelid 02/03/2015  . Osteoporosis 06/10/2014  . Hyperglycemia 03/06/2014  . Dermatitis 03/06/2014  . Chronic venous insufficiency 12/08/2013  . Altered mental status 10/20/2013  . Complicated UTI (urinary tract infection) 05/01/2013  . Edema 04/02/2013  . Dysphagia 04/02/2013  . Depression 02/25/2013  . Constipation 02/25/2013  . Essential hypertension, benign 02/25/2013  . Alzheimer's disease 02/25/2013    CMP     Component Value Date/Time   NA 143 11/30/2016   K 5.1 11/30/2016   CL 94 (L) 06/02/2012 0950   CO2 31 06/02/2012 0950   GLUCOSE 93 06/02/2012 0950   BUN 18 11/30/2016   CREATININE 0.5 11/30/2016   CREATININE 0.60 06/02/2012 0950   CALCIUM 9.4 06/02/2012 0950   PROT 6.4 06/02/2012 0950   ALBUMIN 3.6 06/02/2012 0950   AST 15 11/30/2016   ALT 7 11/30/2016   ALKPHOS 50 11/30/2016   BILITOT 0.5 06/02/2012 0950   GFRNONAA 80 (L) 06/02/2012 0950   GFRAA >90 06/02/2012 0950    Recent Labs  12/21/15 06/09/16 11/30/16  NA 140 139 143  K 4.2 4.2 5.1  BUN 21 18 18   CREATININE 0.8 0.7 0.5    Recent Labs  06/09/16 11/30/16  AST 20 15  ALT 9 7    ALKPHOS 58 50    Recent Labs  12/21/15 06/09/16 11/30/16  WBC 8.9 8.1 6.9  HGB 13.0 14.1 14.3  HCT 39 42 43  PLT 211 241 197    Recent Labs  06/09/16 11/30/16  CHOL 195 181  LDLCALC 143 127  TRIG 71 65   Lab Results  Component Value Date   MICROALBUR 82 01/31/2016   MICROALBUR 4.7 01/31/2016   Lab Results  Component Value Date   TSH 1.31 11/30/2016   Lab Results  Component Value  Date   HGBA1C 5.3 11/30/2016   Lab Results  Component Value Date   CHOL 181 11/30/2016   HDL 41 11/30/2016   LDLCALC 127 11/30/2016   TRIG 65 11/30/2016    Significant Diagnostic Results in last 30 days:  No results found.  Assessment and Plan  Vitamin D deficiency Pt continues on Vit D 2000 u daily; Vit D level 46.5 this month so cont current med  Constipation No reported problems;plan to cont senokot -S  2 po daily  Bilateral lower extremity edema Waxes and wanes chronically; plan to cont lasix 30 mg daily    Sandra Holland D. Lyn Hollingshead, MD

## 2016-12-09 ENCOUNTER — Encounter: Payer: Self-pay | Admitting: Internal Medicine

## 2016-12-10 ENCOUNTER — Encounter: Payer: Self-pay | Admitting: Internal Medicine

## 2016-12-10 NOTE — Assessment & Plan Note (Signed)
Waxes and wanes chronically; plan to cont lasix 30 mg daily

## 2016-12-10 NOTE — Assessment & Plan Note (Signed)
No reported problems;plan to cont senokot -S  2 po daily

## 2016-12-10 NOTE — Assessment & Plan Note (Signed)
Pt continues on Vit D 2000 u daily; Vit D level 46.5 this month so cont current med

## 2016-12-11 DIAGNOSIS — F411 Generalized anxiety disorder: Secondary | ICD-10-CM | POA: Diagnosis not present

## 2016-12-11 DIAGNOSIS — F39 Unspecified mood [affective] disorder: Secondary | ICD-10-CM | POA: Diagnosis not present

## 2016-12-11 DIAGNOSIS — F329 Major depressive disorder, single episode, unspecified: Secondary | ICD-10-CM | POA: Diagnosis not present

## 2016-12-11 DIAGNOSIS — R69 Illness, unspecified: Secondary | ICD-10-CM | POA: Diagnosis not present

## 2017-01-08 DIAGNOSIS — F411 Generalized anxiety disorder: Secondary | ICD-10-CM | POA: Diagnosis not present

## 2017-01-08 DIAGNOSIS — F329 Major depressive disorder, single episode, unspecified: Secondary | ICD-10-CM | POA: Diagnosis not present

## 2017-01-08 DIAGNOSIS — F39 Unspecified mood [affective] disorder: Secondary | ICD-10-CM | POA: Diagnosis not present

## 2017-01-08 DIAGNOSIS — R69 Illness, unspecified: Secondary | ICD-10-CM | POA: Diagnosis not present

## 2017-01-09 ENCOUNTER — Non-Acute Institutional Stay (SKILLED_NURSING_FACILITY): Payer: Medicare HMO | Admitting: Internal Medicine

## 2017-01-09 ENCOUNTER — Encounter: Payer: Self-pay | Admitting: Internal Medicine

## 2017-01-09 DIAGNOSIS — G309 Alzheimer's disease, unspecified: Secondary | ICD-10-CM

## 2017-01-09 DIAGNOSIS — F419 Anxiety disorder, unspecified: Secondary | ICD-10-CM

## 2017-01-09 DIAGNOSIS — R69 Illness, unspecified: Secondary | ICD-10-CM | POA: Diagnosis not present

## 2017-01-09 DIAGNOSIS — F329 Major depressive disorder, single episode, unspecified: Secondary | ICD-10-CM

## 2017-01-09 DIAGNOSIS — F32A Depression, unspecified: Secondary | ICD-10-CM

## 2017-01-09 DIAGNOSIS — F0281 Dementia in other diseases classified elsewhere with behavioral disturbance: Secondary | ICD-10-CM | POA: Diagnosis not present

## 2017-01-09 NOTE — Progress Notes (Signed)
Location:  [facility] Room Number: 319P Place of Service:  SNF [phone])  Margit Hanks, MD  Patient Care Team: Margit Hanks, MD as PCP - General (Internal Medicine)  Extended Emergency Contact Information Primary Emergency Contact: Simmons,Tanya Address: [address]          [place], [place] [address] [place] Home Phone: [phone] Mobile Phone: [phone] Relation: Grandaughter Secondary Emergency Contact: Lenard Galloway Address: [address]          [place], [place] [address] [place] Home Phone: [phone] Relation: Daughter    Allergies: Hydrocodone and Prednisone  Chief Complaint  Patient presents with  . Medical Management of Chronic Issues    Routine Visit    HPI: Patient is 83 y.o. female who is being sen for routine issues of depression, anxiety and Alzheimer's disease.   Past Medical History:  Diagnosis Date  . Dementia   . DNR (do not resuscitate)   . Hypertension   . Overactive bladder     Past Surgical History:  Procedure Laterality Date  . ADENOIDECTOMY    . bilateral cataract surgery    . FOOT SURGERY    . FRACTURE SURGERY    . JOINT REPLACEMENT    . KNEE SURGERY    . TONSILLECTOMY      Allergies as of 01/09/2017      Reactions   Hydrocodone    Prednisone       Medication List       Accurate as of 01/09/17 11:59 PM. Always use your most recent med list.          acetaminophen 325 MG tablet Commonly known as:  TYLENOL Take 2 tablets (650 mg) by mouth every 6 hours as needed for pain. Also take 2 tablets by mouth (650 mg) at bedtime. Do not exceed 4000 mg APAP in 24 hours.   busPIRone 10 MG tablet Commonly known as:  BUSPAR Take 10 mg by mouth 2 (two) times daily.   calcium-vitamin D 500-200 MG-UNIT tablet Commonly known as:  OSCAL WITH D Take 1 tablet by mouth daily.   carboxymethylcellulose 1 % ophthalmic solution Place 1 drop into both eyes 2  (two) times daily.   CENTRUM SILVER PO Take 1 tablet by mouth daily.   divalproex 125 MG DR tablet Commonly known as:  DEPAKOTE Take 125 mg by mouth 2 (two) times daily.   docusate 50 MG/5ML liquid Commonly known as:  COLACE Take 5 ml by mouth daily.   escitalopram 10 MG tablet Commonly known as:  LEXAPRO Take 10 mg by mouth daily.   feeding supplement (PRO-STAT SUGAR FREE 64) Liqd Take 30 mLs by mouth daily.   furosemide 20 MG tablet Commonly known as:  LASIX Take 30 mg by mouth daily. Give 1.5 tablets by mouth daily.   losartan 50 MG tablet Commonly known as:  COZAAR Take 50 mg by mouth daily.   rivastigmine 9.5 mg/24hr Commonly known as:  EXELON Place 1 patch (9.5 mg total) onto the skin daily.   senna-docusate 8.6-50 MG tablet Commonly known as:  Senokot-S Take 2 tablets by mouth daily.   UNABLE TO FIND Med Name: Ativan Gel  0.5 mg/ml : give 1 ml topically every evening at 4 pm for agitation   UNABLE TO FIND Med Name: Med Pass intiate 4 oz three times a day   UNABLE TO FIND Med Name: Magic Cup : give with lunch  and dinner trays due to weight loss.   UTI-STAT PO Take 30 mLs by mouth 2 (two) times daily.   Vitamin D 2000 units tablet Take 2,000 Units by mouth daily.       No orders of the defined types were placed in this encounter.   Immunization History  Administered Date(s) Administered  . Influenza-Unspecified 07/06/2014, 06/18/2015  . Tdap 04/10/2012    Social History  Substance Use Topics  . Smoking status: Never Smoker  . Smokeless tobacco: Never Used  . Alcohol use No    Review of Systems  UTO 2/2 dementia; per nursing no concerns    Vitals:   01/09/17 1225  BP: 128/74  Pulse: 78  Resp: 18  Temp: 98.6 F (37 C)   Body mass index is 23.83 kg/m. Physical Exam  GENERAL APPEARANCE: Alert, conversant, No acute distress  SKIN: No diaphoresis rash HEENT: Unremarkable RESPIRATORY: Breathing is even, unlabored. Lung sounds are  clear   CARDIOVASCULAR: Heart RRR no murmurs, rubs or gallops. No peripheral edema  GASTROINTESTINAL: Abdomen is soft, non-tender, not distended w/ normal bowel sounds.  GENITOURINARY: Bladder non tender, not distended  MUSCULOSKELETAL: No abnormal joints or musculature NEUROLOGIC: Cranial nerves 2-12 grossly intact. Moves all extremities PSYCHIATRIC: Mood and affect with dementia no behavioral issues  Patient Active Problem List   Diagnosis Date Noted  . Mood disorder (HCC) 06/11/2016  . Bilateral lower extremity edema 06/11/2016  . Vitamin D deficiency 05/09/2016  . Anxiety 05/09/2016  . Infection due to ESBL-producing Escherichia coli 02/11/2016  . Elbow pain 01/27/2016  . Cerumen impaction 12/30/2015  . Contusion 12/20/2015  . UTI (urinary tract infection) 06/07/2015  . Hordeolum internum of right lower eyelid 02/03/2015  . Osteoporosis 06/10/2014  . Hyperglycemia 03/06/2014  . Dermatitis 03/06/2014  . Chronic venous insufficiency 12/08/2013  . Altered mental status 10/20/2013  . Complicated UTI (urinary tract infection) 05/01/2013  . Edema 04/02/2013  . Dysphagia 04/02/2013  . Depression 02/25/2013  . Constipation 02/25/2013  . Essential hypertension, benign 02/25/2013  . Alzheimer's disease 02/25/2013    CMP     Component Value Date/Time   NA 143 11/30/2016   K 5.1 11/30/2016   CL 94 (L) 06/02/2012 0950   CO2 31 06/02/2012 0950   GLUCOSE 93 06/02/2012 0950   BUN 18 11/30/2016   CREATININE 0.5 11/30/2016   CREATININE 0.60 06/02/2012 0950   CALCIUM 9.4 06/02/2012 0950   PROT 6.4 06/02/2012 0950   ALBUMIN 3.6 06/02/2012 0950   AST 15 11/30/2016   ALT 7 11/30/2016   ALKPHOS 50 11/30/2016   BILITOT 0.5 06/02/2012 0950   GFRNONAA 80 (L) 06/02/2012 0950   GFRAA >90 06/02/2012 0950    Recent Labs  06/09/16 11/30/16  NA 139 143  K 4.2 5.1  BUN 18 18  CREATININE 0.7 0.5    Recent Labs  06/09/16 11/30/16  AST 20 15  ALT 9 7  ALKPHOS 58 50    Recent  Labs  06/09/16 11/30/16  WBC 8.1 6.9  HGB 14.1 14.3  HCT 42 43  PLT 241 197    Recent Labs  06/09/16 11/30/16  CHOL 195 181  LDLCALC 143 127  TRIG 71 65   Lab Results  Component Value Date   MICROALBUR 82 01/31/2016   MICROALBUR 4.7 01/31/2016   Lab Results  Component Value Date   TSH 1.31 11/30/2016   Lab Results  Component Value Date   HGBA1C 5.3 11/30/2016   Lab Results  Component  Value Date   CHOL 181 11/30/2016   HDL 41 11/30/2016   LDLCALC 127 11/30/2016   TRIG 65 11/30/2016    Significant Diagnostic Results in last 30 days:  No results found.  Assessment and Plan  Depression Pt appears to have done well with GDR from  lexapro 15 mg daily to 10 mg daily;plan to cont 10 mg daily  Anxiety Pt has tolerated well the GDR of buspar to 10 mg BID, down from TID; plan to cont BID dosing.  Alzheimer's disease Chronic and stable with no major declines;plan to cont exelon patch 9.5 mg daily     Macala Baldonado D. Lyn Hollingshead, MD

## 2017-01-28 ENCOUNTER — Encounter: Payer: Self-pay | Admitting: Internal Medicine

## 2017-01-28 NOTE — Assessment & Plan Note (Signed)
Chronic and stable with no major declines;plan to cont exelon patch 9.5 mg daily

## 2017-01-28 NOTE — Assessment & Plan Note (Signed)
Pt has tolerated well the GDR of buspar to 10 mg BID, down from TID; plan to cont BID dosing.

## 2017-01-28 NOTE — Assessment & Plan Note (Signed)
Pt appears to have done well with GDR from  lexapro 15 mg daily to 10 mg daily;plan to cont 10 mg daily

## 2017-02-05 ENCOUNTER — Non-Acute Institutional Stay (SKILLED_NURSING_FACILITY): Payer: Medicare HMO | Admitting: Internal Medicine

## 2017-02-05 ENCOUNTER — Encounter: Payer: Self-pay | Admitting: Internal Medicine

## 2017-02-05 DIAGNOSIS — F39 Unspecified mood [affective] disorder: Secondary | ICD-10-CM

## 2017-02-05 DIAGNOSIS — R69 Illness, unspecified: Secondary | ICD-10-CM | POA: Diagnosis not present

## 2017-02-05 DIAGNOSIS — I10 Essential (primary) hypertension: Secondary | ICD-10-CM

## 2017-02-05 DIAGNOSIS — M810 Age-related osteoporosis without current pathological fracture: Secondary | ICD-10-CM | POA: Diagnosis not present

## 2017-02-05 NOTE — Progress Notes (Signed)
Location:  [facility] Room Number: 319P Place of Service:  SNF [phone])  Margit Hanks, MD  Patient Care Team: Margit Hanks, MD as PCP - General (Internal Medicine)  Extended Emergency Contact Information Primary Emergency Contact: Simmons,Tanya Address: [address]          [place], [place] [address] [place] Home Phone: [phone] Mobile Phone: [phone] Relation: Grandaughter Secondary Emergency Contact: Lenard Galloway Address: [address]          [place], [place] [address] [place] Home Phone: [phone] Relation: Daughter    Allergies: Hydrocodone and Prednisone  Chief Complaint  Patient presents with  . Medical Management of Chronic Issues    Routine Visit    HPI: Patient is 83 y.o. female who is being seen for routine issues of mood disorder, osteoporosis and HTN.  Past Medical History:  Diagnosis Date  . Dementia   . DNR (do not resuscitate)   . Hypertension   . Overactive bladder     Past Surgical History:  Procedure Laterality Date  . ADENOIDECTOMY    . bilateral cataract surgery    . FOOT SURGERY    . FRACTURE SURGERY    . JOINT REPLACEMENT    . KNEE SURGERY    . TONSILLECTOMY      Allergies as of 02/05/2017      Reactions   Hydrocodone    Prednisone       Medication List       Accurate as of 02/05/17 11:59 PM. Always use your most recent med list.          acetaminophen 325 MG tablet Commonly known as:  TYLENOL Take 2 tablets (650 mg) by mouth every 6 hours as needed for pain. Also take 2 tablets by mouth (650 mg) at bedtime. Do not exceed 4000 mg APAP in 24 hours.   busPIRone 10 MG tablet Commonly known as:  BUSPAR Take 10 mg by mouth 2 (two) times daily.   calcium-vitamin D 500-200 MG-UNIT tablet Commonly known as:  OSCAL WITH D Take 1 tablet by mouth daily.   carboxymethylcellulose 1 % ophthalmic solution Place 1 drop into both eyes 2 (two)  times daily.   CENTRUM SILVER PO Take 1 tablet by mouth daily.   divalproex 125 MG DR tablet Commonly known as:  DEPAKOTE Take 125 mg by mouth 2 (two) times daily.   docusate 50 MG/5ML liquid Commonly known as:  COLACE Take 5 ml by mouth daily.   escitalopram 10 MG tablet Commonly known as:  LEXAPRO Take 10 mg by mouth daily.   feeding supplement (PRO-STAT SUGAR FREE 64) Liqd Take 30 mLs by mouth daily.   furosemide 20 MG tablet Commonly known as:  LASIX Take 30 mg by mouth daily. Give 1.5 tablets by mouth daily.   losartan 50 MG tablet Commonly known as:  COZAAR Take 50 mg by mouth daily.   rivastigmine 9.5 mg/24hr Commonly known as:  EXELON Place 1 patch (9.5 mg total) onto the skin daily.   senna-docusate 8.6-50 MG tablet Commonly known as:  Senokot-S Take 2 tablets by mouth daily.   UNABLE TO FIND Med Name: Ativan Gel  0.5 mg/ml : give 1 ml topically every evening at 4 pm for agitation   UNABLE TO FIND Med Name: Med Pass intiate 4 oz three times a day   UNABLE TO FIND Med Name: Magic Cup : give with lunch and  dinner trays due to weight loss.   UTI-STAT PO Take 30 mLs by mouth 2 (two) times daily.   Vitamin D 2000 units tablet Take 2,000 Units by mouth daily.       No orders of the defined types were placed in this encounter.   Immunization History  Administered Date(s) Administered  . Influenza-Unspecified 07/06/2014, 06/18/2015  . Tdap 04/10/2012    Social History  Substance Use Topics  . Smoking status: Never Smoker  . Smokeless tobacco: Never Used  . Alcohol use No    Review of Systems  UTO 2/2 dementia; nursing with no new concerns   Vitals:   02/05/17 0957  BP: 101/64  Pulse: 62  Resp: 20  Temp: 97.4 F (36.3 C)   Body mass index is 22.43 kg/m. Physical Exam  GENERAL APPEARANCE: Alert, conversant, No acute distress  SKIN: No diaphoresis rash HEENT: Unremarkable RESPIRATORY: Breathing is even, unlabored. Lung sounds are  clear   CARDIOVASCULAR: Heart RRR no murmurs, rubs or gallops. some peripheral edema  GASTROINTESTINAL: Abdomen is soft, non-tender, not distended w/ normal bowel sounds.  GENITOURINARY: Bladder non tender, not distended  MUSCULOSKELETAL: No abnormal joints or musculature NEUROLOGIC: Cranial nerves 2-12 grossly intact. Moves all extremities PSYCHIATRIC: Mood and affect with dementia, no behavioral issues  Patient Active Problem List   Diagnosis Date Noted  . Mood disorder (HCC) 06/11/2016  . Bilateral lower extremity edema 06/11/2016  . Vitamin D deficiency 05/09/2016  . Anxiety 05/09/2016  . Infection due to ESBL-producing Escherichia coli 02/11/2016  . Elbow pain 01/27/2016  . Cerumen impaction 12/30/2015  . Contusion 12/20/2015  . UTI (urinary tract infection) 06/07/2015  . Hordeolum internum of right lower eyelid 02/03/2015  . Osteoporosis 06/10/2014  . Hyperglycemia 03/06/2014  . Dermatitis 03/06/2014  . Chronic venous insufficiency 12/08/2013  . Altered mental status 10/20/2013  . Complicated UTI (urinary tract infection) 05/01/2013  . Edema 04/02/2013  . Dysphagia 04/02/2013  . Depression 02/25/2013  . Constipation 02/25/2013  . Essential hypertension, benign 02/25/2013  . Alzheimer's disease 02/25/2013    CMP     Component Value Date/Time   NA 143 11/30/2016   K 5.1 11/30/2016   CL 94 (L) 06/02/2012 0950   CO2 31 06/02/2012 0950   GLUCOSE 93 06/02/2012 0950   BUN 18 11/30/2016   CREATININE 0.5 11/30/2016   CREATININE 0.60 06/02/2012 0950   CALCIUM 9.4 06/02/2012 0950   PROT 6.4 06/02/2012 0950   ALBUMIN 3.6 06/02/2012 0950   AST 15 11/30/2016   ALT 7 11/30/2016   ALKPHOS 50 11/30/2016   BILITOT 0.5 06/02/2012 0950   GFRNONAA 80 (L) 06/02/2012 0950   GFRAA >90 06/02/2012 0950    Recent Labs  06/09/16 11/30/16  NA 139 143  K 4.2 5.1  BUN 18 18  CREATININE 0.7 0.5    Recent Labs  06/09/16 11/30/16  AST 20 15  ALT 9 7  ALKPHOS 58 50    Recent  Labs  06/09/16 11/18/16 11/30/16  WBC 8.1 6.6 6.9  HGB 14.1 13.5 14.3  HCT 42 40 43  PLT 241 193 197    Recent Labs  06/09/16 11/30/16  CHOL 195 181  LDLCALC 143 127  TRIG 71 65   Lab Results  Component Value Date   MICROALBUR 82 01/31/2016   MICROALBUR 4.7 01/31/2016   Lab Results  Component Value Date   TSH 1.31 11/30/2016   Lab Results  Component Value Date   HGBA1C 5.3 11/30/2016  Lab Results  Component Value Date   CHOL 181 11/30/2016   HDL 41 11/30/2016   LDLCALC 127 11/30/2016   TRIG 65 11/30/2016    Significant Diagnostic Results in last 30 days:  No results found.  Assessment and Plan  Mood disorder (HCC) Chronic and stable;plan to cont depakote DR 125 mg BID  Osteoporosis Chronic and stable without fracture;plan to cont oscal + D 500-200 daily  Essential hypertension, benign Controlled; cont cozaar 50 mg daily and lasix 30 mg daily     Brylen Wagar D. Lyn Hollingshead, MD

## 2017-02-12 ENCOUNTER — Encounter: Payer: Self-pay | Admitting: Internal Medicine

## 2017-02-12 NOTE — Assessment & Plan Note (Signed)
Chronic and stable without fracture;plan to cont oscal + D 500-200 daily

## 2017-02-12 NOTE — Assessment & Plan Note (Signed)
Chronic and stable;plan to cont depakote DR 125 mg BID

## 2017-02-12 NOTE — Assessment & Plan Note (Signed)
Controlled; cont cozaar 50 mg daily and lasix 30 mg daily

## 2017-03-05 DIAGNOSIS — R69 Illness, unspecified: Secondary | ICD-10-CM | POA: Diagnosis not present

## 2017-03-05 DIAGNOSIS — F331 Major depressive disorder, recurrent, moderate: Secondary | ICD-10-CM | POA: Diagnosis not present

## 2017-03-05 DIAGNOSIS — F39 Unspecified mood [affective] disorder: Secondary | ICD-10-CM | POA: Diagnosis not present

## 2017-03-05 DIAGNOSIS — F411 Generalized anxiety disorder: Secondary | ICD-10-CM | POA: Diagnosis not present

## 2017-03-06 ENCOUNTER — Encounter: Payer: Self-pay | Admitting: Internal Medicine

## 2017-03-06 ENCOUNTER — Non-Acute Institutional Stay (SKILLED_NURSING_FACILITY): Payer: Medicare HMO | Admitting: Internal Medicine

## 2017-03-06 DIAGNOSIS — K5901 Slow transit constipation: Secondary | ICD-10-CM | POA: Diagnosis not present

## 2017-03-06 DIAGNOSIS — E559 Vitamin D deficiency, unspecified: Secondary | ICD-10-CM

## 2017-03-06 DIAGNOSIS — Z87440 Personal history of urinary (tract) infections: Secondary | ICD-10-CM

## 2017-03-06 NOTE — Progress Notes (Signed)
Location:  [facility] Room Number: 319 Place of Service:  SNF [phone])  Margit Hanks, MD  Patient Care Team: Margit Hanks, MD as PCP - General (Internal Medicine)  Extended Emergency Contact Information Primary Emergency Contact: Simmons,Tanya Address: [address]          [place], [place] [address] [place] Home Phone: [phone] Mobile Phone: [phone] Relation: Grandaughter Secondary Emergency Contact: Lenard Galloway Address: [address]          [place], [place] [address] [place] Home Phone: [phone] Relation: Daughter    Allergies: Hydrocodone and Prednisone  Chief Complaint  Patient presents with  . Medical Management of Chronic Issues    routine visit    HPI: Patient is 83 y.o. female who Who is being seen for routine issues of frequent urinary tract infections, vitamin D deficiency, and constipation.  Past Medical History:  Diagnosis Date  . Alzheimer's disease 02/25/2013  . Alzheimer's disease, unspecified (CODE)   . Anxiety disorder, unspecified   . Cellulitis of unspecified part of limb   . Constipated   . Dementia   . Depression 02/25/2013  . Diverticulosis of intestine without perforation or abscess without bleeding   . DNR (do not resuscitate)   . Dysphagia 04/02/2013  . Dysphagia, oropharyngeal phase   . Edema 04/02/2013  . Hordeolum internum of right lower eyelid 02/03/2015  . Hyperglycemia 03/06/2014  . Hypertension   . Major depressive disorder, single episode, unspecified   . Osteoarthritis   . Osteoporosis 06/10/2014  . Overactive bladder   . Vitamin D deficiency 05/09/2016    Past Surgical History:  Procedure Laterality Date  . ADENOIDECTOMY    . bilateral cataract surgery    . FOOT SURGERY    . FRACTURE SURGERY    . JOINT REPLACEMENT    . KNEE SURGERY    . TONSILLECTOMY      Allergies as of 03/06/2017      Reactions   Hydrocodone    Prednisone         Medication List       Accurate as of 03/06/17 11:59 PM. Always use your most recent med list.          acetaminophen 325 MG tablet Commonly known as:  TYLENOL Take 2 tablets (650 mg) by mouth every 6 hours as needed for pain. Also take 2 tablets by mouth (650 mg) at bedtime. Do not exceed 4000 mg APAP in 24 hours.   busPIRone 10 MG tablet Commonly known as:  BUSPAR Take 10 mg by mouth 2 (two) times daily.   calcium-vitamin D 500-200 MG-UNIT tablet Commonly known as:  OSCAL WITH D Take 1 tablet by mouth daily.   carboxymethylcellulose 1 % ophthalmic solution Place 1 drop into both eyes 2 (two) times daily.   CENTRUM SILVER PO Take 1 tablet by mouth daily.   divalproex 125 MG DR tablet Commonly known as:  DEPAKOTE Take 125 mg by mouth 2 (two) times daily.   docusate 50 MG/5ML liquid Commonly known as:  COLACE Take 5 ml by mouth daily.   escitalopram 10 MG tablet Commonly known as:  LEXAPRO Take 10 mg by mouth daily.   furosemide 20 MG tablet Commonly known as:  LASIX Take 30 mg by mouth daily. Give 1.5 tablets by mouth daily.   losartan 50 MG tablet Commonly known as:  COZAAR Take 50 mg by mouth daily.   rivastigmine  9.5 mg/24hr Commonly known as:  EXELON Place 1 patch (9.5 mg total) onto the skin daily.   senna-docusate 8.6-50 MG tablet Commonly known as:  Senokot-S Take 2 tablets by mouth daily.   UNABLE TO FIND Med Name: Ativan Gel  0.5 mg/ml : give 1 ml topically every evening at 4 pm for agitation   UNABLE TO FIND Med Name: Med Pass intiate 4 oz three times a day   UNABLE TO FIND Med Name: Magic Cup : give with lunch and dinner trays due to weight loss.   UTI-STAT PO Take 30 mLs by mouth 2 (two) times daily.       No orders of the defined types were placed in this encounter.   Immunization History  Administered Date(s) Administered  . Influenza-Unspecified 07/06/2014, 06/18/2015  . Tdap 04/10/2012    Social History  Substance Use  Topics  . Smoking status: Never Smoker  . Smokeless tobacco: Never Used  . Alcohol use No    Review of Systems-Unable to obtain secondary to dementia; nursing no new concerns    Vitals:   03/06/17 1301  BP: 113/76  Pulse: 84  Resp: 18  Temp: 97.6 F (36.4 C)   Body mass index is 22.43 kg/m. Physical Exam  GENERAL APPEARANCE: Alert, conversant, No acute distress  SKIN: No diaphoresis rash HEENT: Unremarkable RESPIRATORY: Breathing is even, unlabored. Lung sounds are clear   CARDIOVASCULAR: Heart RRR no murmurs, rubs or gallops. No peripheral edema  GASTROINTESTINAL: Abdomen is soft, non-tender, not distended w/ normal bowel sounds.  GENITOURINARY: Bladder non tender, not distended  MUSCULOSKELETAL: No abnormal joints or musculature NEUROLOGIC: Cranial nerves 2-12 grossly intact. Moves all extremities PSYCHIATRIC: Dementia, no behavioral issues  Patient Active Problem List   Diagnosis Date Noted  . History of recurrent UTIs 04/05/2017  . Mood disorder (HCC) 06/11/2016  . Bilateral lower extremity edema 06/11/2016  . Vitamin D deficiency 05/09/2016  . Anxiety 05/09/2016  . Infection due to ESBL-producing Escherichia coli 02/11/2016  . Elbow pain 01/27/2016  . Cerumen impaction 12/30/2015  . Contusion 12/20/2015  . UTI (urinary tract infection) 06/07/2015  . Hordeolum internum of right lower eyelid 02/03/2015  . Osteoporosis 06/10/2014  . Hyperglycemia 03/06/2014  . Dermatitis 03/06/2014  . Chronic venous insufficiency 12/08/2013  . Altered mental status 10/20/2013  . Complicated UTI (urinary tract infection) 05/01/2013  . Edema 04/02/2013  . Dysphagia 04/02/2013  . Depression 02/25/2013  . Constipation 02/25/2013  . Essential hypertension, benign 02/25/2013  . Alzheimer's disease 02/25/2013    CMP     Component Value Date/Time   NA 143 11/30/2016   K 5.1 11/30/2016   CL 94 (L) 06/02/2012 0950   CO2 31 06/02/2012 0950   GLUCOSE 93 06/02/2012 0950   BUN  18 11/30/2016   CREATININE 0.5 11/30/2016   CREATININE 0.60 06/02/2012 0950   CALCIUM 9.4 06/02/2012 0950   PROT 6.4 06/02/2012 0950   ALBUMIN 3.6 06/02/2012 0950   AST 15 11/30/2016   ALT 7 11/30/2016   ALKPHOS 50 11/30/2016   BILITOT 0.5 06/02/2012 0950   GFRNONAA 80 (L) 06/02/2012 0950   GFRAA >90 06/02/2012 0950    Recent Labs  06/09/16 11/30/16  NA 139 143  K 4.2 5.1  BUN 18 18  CREATININE 0.7 0.5    Recent Labs  06/09/16 11/30/16  AST 20 15  ALT 9 7  ALKPHOS 58 50    Recent Labs  06/09/16 11/18/16 11/30/16  WBC 8.1 6.6 6.9  HGB  14.1 13.5 14.3  HCT 42 40 43  PLT 241 193 197    Recent Labs  06/09/16 11/30/16  CHOL 195 181  LDLCALC 143 127  TRIG 71 65   Lab Results  Component Value Date   MICROALBUR 82 01/31/2016   MICROALBUR 4.7 01/31/2016   Lab Results  Component Value Date   TSH 1.31 11/30/2016   Lab Results  Component Value Date   HGBA1C 5.3 11/30/2016   Lab Results  Component Value Date   CHOL 181 11/30/2016   HDL 41 11/30/2016   LDLCALC 127 11/30/2016   TRIG 65 11/30/2016    Significant Diagnostic Results in last 30 days:  No results found.  Assessment and Plan  History of recurrent UTIs Stable without recent infections; continue UTI stat 30 mils by mouth twice a day  Vitamin D deficiency Stable; can decrease to 1000 units by mouth daily  Constipation Chronic and stable; continue Senokot S 2 by mouth daily     Thurston Holenne D. Lyn HollingsheadAlexander, MD

## 2017-04-02 ENCOUNTER — Other Ambulatory Visit: Payer: Self-pay | Admitting: *Deleted

## 2017-04-02 MED ORDER — UNABLE TO FIND: 0 refills | Status: AC

## 2017-04-02 NOTE — Telephone Encounter (Signed)
[facility] #[phone] Fax: [phone]  

## 2017-04-03 ENCOUNTER — Encounter: Payer: Self-pay | Admitting: Internal Medicine

## 2017-04-03 ENCOUNTER — Non-Acute Institutional Stay (SKILLED_NURSING_FACILITY): Payer: Medicare HMO | Admitting: Internal Medicine

## 2017-04-03 DIAGNOSIS — G309 Alzheimer's disease, unspecified: Secondary | ICD-10-CM | POA: Diagnosis not present

## 2017-04-03 DIAGNOSIS — F0281 Dementia in other diseases classified elsewhere with behavioral disturbance: Secondary | ICD-10-CM | POA: Diagnosis not present

## 2017-04-03 DIAGNOSIS — I10 Essential (primary) hypertension: Secondary | ICD-10-CM

## 2017-04-03 DIAGNOSIS — F419 Anxiety disorder, unspecified: Secondary | ICD-10-CM | POA: Diagnosis not present

## 2017-04-03 DIAGNOSIS — R69 Illness, unspecified: Secondary | ICD-10-CM | POA: Diagnosis not present

## 2017-04-03 NOTE — Progress Notes (Signed)
Location:  [facility] Room Number: 319 Place of Service:  SNF [phone])  Margit Hanks, MD  Patient Care Team: Margit Hanks, MD as PCP - General (Internal Medicine)  Extended Emergency Contact Information Primary Emergency Contact: Simmons,Tanya Address: [address]          [place], [place] [address] [place] Home Phone: [phone] Mobile Phone: [phone] Relation: Grandaughter Secondary Emergency Contact: Lenard Galloway Address: [address]          [place], [place] [address] [place] Home Phone: [phone] Relation: Daughter    Allergies: Hydrocodone and Prednisone  Chief Complaint  Patient presents with  . Medical Management of Chronic Issues    routine visit    HPI: Patient is 83 y.o. female who Is being seen for routine issues of hypertension, Alzheimer's dementia, and anxiety.  Past Medical History:  Diagnosis Date  . Alzheimer's disease 02/25/2013  . Alzheimer's disease, unspecified (CODE)   . Anxiety disorder, unspecified   . Cellulitis of unspecified part of limb   . Constipated   . Dementia   . Depression 02/25/2013  . Diverticulosis of intestine without perforation or abscess without bleeding   . DNR (do not resuscitate)   . Dysphagia 04/02/2013  . Dysphagia, oropharyngeal phase   . Edema 04/02/2013  . Hordeolum internum of right lower eyelid 02/03/2015  . Hyperglycemia 03/06/2014  . Hypertension   . Major depressive disorder, single episode, unspecified   . Osteoarthritis   . Osteoporosis 06/10/2014  . Overactive bladder   . Vitamin D deficiency 05/09/2016    Past Surgical History:  Procedure Laterality Date  . ADENOIDECTOMY    . bilateral cataract surgery    . FOOT SURGERY    . FRACTURE SURGERY    . JOINT REPLACEMENT    . KNEE SURGERY    . TONSILLECTOMY      Allergies as of 04/03/2017      Reactions   Hydrocodone    Prednisone       Medication List         Accurate as of 04/03/17 11:59 PM. Always use your most recent med list.          acetaminophen 325 MG tablet Commonly known as:  TYLENOL Take 2 tablets (650 mg) by mouth every 6 hours as needed for pain. Also take 2 tablets by mouth (650 mg) at bedtime. Do not exceed 4000 mg APAP in 24 hours.   busPIRone 10 MG tablet Commonly known as:  BUSPAR Take 10 mg by mouth 2 (two) times daily.   calcium-vitamin D 500-200 MG-UNIT tablet Commonly known as:  OSCAL WITH D Take 1 tablet by mouth daily.   carboxymethylcellulose 1 % ophthalmic solution Place 1 drop into both eyes 2 (two) times daily.   CENTRUM SILVER PO Take 1 tablet by mouth daily.   divalproex 125 MG DR tablet Commonly known as:  DEPAKOTE Take 125 mg by mouth 2 (two) times daily.   docusate 50 MG/5ML liquid Commonly known as:  COLACE Take 5 ml by mouth daily.   furosemide 20 MG tablet Commonly known as:  LASIX Take 30 mg by mouth daily. Give 1.5 tablets by mouth daily.   losartan 50 MG tablet Commonly known as:  COZAAR Take 50 mg by mouth daily.   rivastigmine 9.5 mg/24hr Commonly known as:  EXELON Place 1 patch (9.5 mg total) onto the skin daily.   senna-docusate 8.6-50 MG  tablet Commonly known as:  Senokot-S Take 2 tablets by mouth daily.   UNABLE TO FIND Med Name: Ativan Gel  0.5 mg/ml : Apply 1 ml topically every evening at 4 pm daily for agitation   UNABLE TO FIND Med Name: Med Pass intiate 4 oz three times a day   UNABLE TO FIND Med Name: Magic Cup : give with lunch and dinner trays due to weight loss.   UTI-STAT PO Take 30 mLs by mouth 2 (two) times daily.       No orders of the defined types were placed in this encounter.   Immunization History  Administered Date(s) Administered  . Influenza-Unspecified 07/06/2014, 06/18/2015  . Tdap 04/10/2012    Social History  Substance Use Topics  . Smoking status: Never Smoker  . Smokeless tobacco: Never Used  . Alcohol use No    Review of  Systems-Unable to obtain secondary to dementia; nursing-no concerns    Vitals:   04/03/17 1145  BP: 113/76  Pulse: 74  Resp: 16  Temp: 97.6 F (36.4 C)   Body mass index is 21.79 kg/m. Physical Exam  GENERAL APPEARANCE: Alert, conversant, No acute distress  SKIN: No diaphoresis rash HEENT: Unremarkable RESPIRATORY: Breathing is even, unlabored. Lung sounds are clear   CARDIOVASCULAR: Heart RRR no murmurs, rubs or gallops. No peripheral edema  GASTROINTESTINAL: Abdomen is soft, non-tender, not distended w/ normal bowel sounds.  GENITOURINARY: Bladder non tender, not distended  MUSCULOSKELETAL: No abnormal joints or musculature NEUROLOGIC: Cranial nerves 2-12 grossly intact. Moves all extremities PSYCHIATRIC: Dementia, no behavioral issues  Patient Active Problem List   Diagnosis Date Noted  . History of recurrent UTIs 04/05/2017  . Mood disorder (HCC) 06/11/2016  . Bilateral lower extremity edema 06/11/2016  . Vitamin D deficiency 05/09/2016  . Anxiety 05/09/2016  . Infection due to ESBL-producing Escherichia coli 02/11/2016  . Elbow pain 01/27/2016  . Cerumen impaction 12/30/2015  . Contusion 12/20/2015  . UTI (urinary tract infection) 06/07/2015  . Hordeolum internum of right lower eyelid 02/03/2015  . Osteoporosis 06/10/2014  . Hyperglycemia 03/06/2014  . Dermatitis 03/06/2014  . Chronic venous insufficiency 12/08/2013  . Altered mental status 10/20/2013  . Complicated UTI (urinary tract infection) 05/01/2013  . Edema 04/02/2013  . Dysphagia 04/02/2013  . Depression 02/25/2013  . Constipation 02/25/2013  . Essential hypertension, benign 02/25/2013  . Alzheimer's disease 02/25/2013    CMP     Component Value Date/Time   NA 143 11/30/2016   K 5.1 11/30/2016   CL 94 (L) 06/02/2012 0950   CO2 31 06/02/2012 0950   GLUCOSE 93 06/02/2012 0950   BUN 18 11/30/2016   CREATININE 0.5 11/30/2016   CREATININE 0.60 06/02/2012 0950   CALCIUM 9.4 06/02/2012 0950    PROT 6.4 06/02/2012 0950   ALBUMIN 3.6 06/02/2012 0950   AST 15 11/30/2016   ALT 7 11/30/2016   ALKPHOS 50 11/30/2016   BILITOT 0.5 06/02/2012 0950   GFRNONAA 80 (L) 06/02/2012 0950   GFRAA >90 06/02/2012 0950    Recent Labs  06/09/16 11/30/16  NA 139 143  K 4.2 5.1  BUN 18 18  CREATININE 0.7 0.5    Recent Labs  06/09/16 11/30/16  AST 20 15  ALT 9 7  ALKPHOS 58 50    Recent Labs  06/09/16 11/18/16 11/30/16  WBC 8.1 6.6 6.9  HGB 14.1 13.5 14.3  HCT 42 40 43  PLT 241 193 197    Recent Labs  06/09/16 11/30/16  CHOL  195 181  LDLCALC 143 127  TRIG 71 65   Lab Results  Component Value Date   MICROALBUR 82 01/31/2016   MICROALBUR 4.7 01/31/2016   Lab Results  Component Value Date   TSH 1.31 11/30/2016   Lab Results  Component Value Date   HGBA1C 5.3 11/30/2016   Lab Results  Component Value Date   CHOL 181 11/30/2016   HDL 41 11/30/2016   LDLCALC 127 11/30/2016   TRIG 65 11/30/2016    Significant Diagnostic Results in last 30 days:  No results found.  Assessment and Plan  Essential hypertension, benign Controlled; continue Lasix 30 mg by mouth daily Cozaar 50 mg by mouth daily  Alzheimer's disease Chronic but fairly stable, no major declines; plan to continue Exelon patch 9.5 mg daily  Anxiety Controlled; plan to continue BuSpar 10 mg by mouth twice a day    Tevis Conger D. Lyn Hollingshead, MD

## 2017-04-04 ENCOUNTER — Non-Acute Institutional Stay (SKILLED_NURSING_FACILITY): Payer: Medicare HMO

## 2017-04-04 DIAGNOSIS — R569 Unspecified convulsions: Secondary | ICD-10-CM | POA: Diagnosis not present

## 2017-04-04 DIAGNOSIS — Z0000 Encounter for general adult medical examination without abnormal findings: Secondary | ICD-10-CM

## 2017-04-04 NOTE — Patient Instructions (Signed)
Ms. Sandra Holland , Thank you for taking time to come for your Medicare Wellness Visit. I appreciate your ongoing commitment to your health goals. Please review the following plan we discussed and let me know if I can assist you in the future.   Screening recommendations/referrals: Colonoscopy up to date, pt over age 81 Mammogram up to date, pt over age 81 Bone Density due Recommended yearly ophthalmology/optometry visit for glaucoma screening and checkup Recommended yearly dental visit for hygiene and checkup  Vaccinations: Influenza vaccine due 2018 fall season Pneumococcal vaccine 13 due Tdap vaccine up to date. Due 04/10/22 Shingles vaccine not in records  Advanced directives: DNR in chart, need rest of advanced directives for chart  Conditions/risks identified: None  Next appointment: Dr. Lyn HollingsheadAlexander makes rounds   Preventive Care 65 Years and Older, Female Preventive care refers to lifestyle choices and visits with your health care provider that can promote health and wellness. What does preventive care include?  A yearly physical exam. This is also called an annual well check.  Dental exams once or twice a year.  Routine eye exams. Ask your health care provider how often you should have your eyes checked.  Personal lifestyle choices, including:  Daily care of your teeth and gums.  Regular physical activity.  Eating a healthy diet.  Avoiding tobacco and drug use.  Limiting alcohol use.  Practicing safe sex.  Taking low-dose aspirin every day.  Taking vitamin and mineral supplements as recommended by your health care provider. What happens during an annual well check? The services and screenings done by your health care provider during your annual well check will depend on your age, overall health, lifestyle risk factors, and family history of disease. Counseling  Your health care provider may ask you questions about your:  Alcohol use.  Tobacco use.  Drug  use.  Emotional well-being.  Home and relationship well-being.  Sexual activity.  Eating habits.  History of falls.  Memory and ability to understand (cognition).  Work and work Astronomerenvironment.  Reproductive health. Screening  You may have the following tests or measurements:  Height, weight, and BMI.  Blood pressure.  Lipid and cholesterol levels. These may be checked every 5 years, or more frequently if you are over 81 years old.  Skin check.  Lung cancer screening. You may have this screening every year starting at age 81 if you have a 30-pack-year history of smoking and currently smoke or have quit within the past 15 years.  Fecal occult blood test (FOBT) of the stool. You may have this test every year starting at age 450.  Flexible sigmoidoscopy or colonoscopy. You may have a sigmoidoscopy every 5 years or a colonoscopy every 10 years starting at age 750.  Hepatitis C blood test.  Hepatitis B blood test.  Sexually transmitted disease (STD) testing.  Diabetes screening. This is done by checking your blood sugar (glucose) after you have not eaten for a while (fasting). You may have this done every 1-3 years.  Bone density scan. This is done to screen for osteoporosis. You may have this done starting at age 81.  Mammogram. This may be done every 1-2 years. Talk to your health care provider about how often you should have regular mammograms. Talk with your health care provider about your test results, treatment options, and if necessary, the need for more tests. Vaccines  Your health care provider may recommend certain vaccines, such as:  Influenza vaccine. This is recommended every year.  Tetanus, diphtheria,  and acellular pertussis (Tdap, Td) vaccine. You may need a Td booster every 10 years.  Zoster vaccine. You may need this after age 81.  Pneumococcal 13-valent conjugate (PCV13) vaccine. One dose is recommended after age 61.  Pneumococcal polysaccharide  (PPSV23) vaccine. One dose is recommended after age 80. Talk to your health care provider about which screenings and vaccines you need and how often you need them. This information is not intended to replace advice given to you by your health care provider. Make sure you discuss any questions you have with your health care provider. Document Released: 10/01/2015 Document Revised: 05/24/2016 Document Reviewed: 07/06/2015 Elsevier Interactive Patient Education  2017 [place] Prevention in the Home Falls can cause injuries. They can happen to people of all ages. There are many things you can do to make your home safe and to help prevent falls. What can I do on the outside of my home?  Regularly fix the edges of walkways and driveways and fix any cracks.  Remove anything that might make you trip as you walk through a door, such as a raised step or threshold.  Trim any bushes or trees on the path to your home.  Use bright outdoor lighting.  Clear any walking paths of anything that might make someone trip, such as rocks or tools.  Regularly check to see if handrails are loose or broken. Make sure that both sides of any steps have handrails.  Any raised decks and porches should have guardrails on the edges.  Have any leaves, snow, or ice cleared regularly.  Use sand or salt on walking paths during winter.  Clean up any spills in your garage right away. This includes oil or grease spills. What can I do in the bathroom?  Use night lights.  Install grab bars by the toilet and in the tub and shower. Do not use towel bars as grab bars.  Use non-skid mats or decals in the tub or shower.  If you need to sit down in the shower, use a plastic, non-slip stool.  Keep the floor dry. Clean up any water that spills on the floor as soon as it happens.  Remove soap buildup in the tub or shower regularly.  Attach bath mats securely with double-sided non-slip rug tape.  Do not have  throw rugs and other things on the floor that can make you trip. What can I do in the bedroom?  Use night lights.  Make sure that you have a light by your bed that is easy to reach.  Do not use any sheets or blankets that are too big for your bed. They should not hang down onto the floor.  Have a firm chair that has side arms. You can use this for support while you get dressed.  Do not have throw rugs and other things on the floor that can make you trip. What can I do in the kitchen?  Clean up any spills right away.  Avoid walking on wet floors.  Keep items that you use a lot in easy-to-reach places.  If you need to reach something above you, use a strong step stool that has a grab bar.  Keep electrical cords out of the way.  Do not use floor polish or wax that makes floors slippery. If you must use wax, use non-skid floor wax.  Do not have throw rugs and other things on the floor that can make you trip. What can I do with my  stairs?  Do not leave any items on the stairs.  Make sure that there are handrails on both sides of the stairs and use them. Fix handrails that are broken or loose. Make sure that handrails are as long as the stairways.  Check any carpeting to make sure that it is firmly attached to the stairs. Fix any carpet that is loose or worn.  Avoid having throw rugs at the top or bottom of the stairs. If you do have throw rugs, attach them to the floor with carpet tape.  Make sure that you have a light switch at the top of the stairs and the bottom of the stairs. If you do not have them, ask someone to add them for you. What else can I do to help prevent falls?  Wear shoes that:  Do not have high heels.  Have rubber bottoms.  Are comfortable and fit you well.  Are closed at the toe. Do not wear sandals.  If you use a stepladder:  Make sure that it is fully opened. Do not climb a closed stepladder.  Make sure that both sides of the stepladder are  locked into place.  Ask someone to hold it for you, if possible.  Clearly mark and make sure that you can see:  Any grab bars or handrails.  First and last steps.  Where the edge of each step is.  Use tools that help you move around (mobility aids) if they are needed. These include:  Canes.  Walkers.  Scooters.  Crutches.  Turn on the lights when you go into a dark area. Replace any light bulbs as soon as they burn out.  Set up your furniture so you have a clear path. Avoid moving your furniture around.  If any of your floors are uneven, fix them.  If there are any pets around you, be aware of where they are.  Review your medicines with your doctor. Some medicines can make you feel dizzy. This can increase your chance of falling. Ask your doctor what other things that you can do to help prevent falls. This information is not intended to replace advice given to you by your health care provider. Make sure you discuss any questions you have with your health care provider. Document Released: 07/01/2009 Document Revised: 02/10/2016 Document Reviewed: 10/09/2014 Elsevier Interactive Patient Education  2017 [employer].

## 2017-04-04 NOTE — Progress Notes (Signed)
Subjective:   Lindaann PascalBernadine S Vandehei is a 83 y.o. female who presents for an Initial Medicare Annual Wellness Visit at [facility]; incapacitated patient unable to answer questions appropriately     Objective:    Today's Vitals   04/04/17 1425  BP: 110/70  Pulse: 70  Temp: 98.1 F (36.7 C)  TempSrc: Oral  SpO2: 97%  Weight: 127 lb (57.6 kg)  Height: 5\' 3"  (1.6 m)   Body mass index is 22.5 kg/m.   Current Medications (verified) Outpatient Encounter Prescriptions as of 04/04/2017  Medication Sig  . acetaminophen (TYLENOL) 325 MG tablet Take 2 tablets (650 mg) by mouth every 6 hours as needed for pain. Also take 2 tablets by mouth (650 mg) at bedtime. Do not exceed 4000 mg APAP in 24 hours.  . busPIRone (BUSPAR) 10 MG tablet Take 10 mg by mouth 2 (two) times daily.  . calcium-vitamin D (OSCAL WITH D) 500-200 MG-UNIT per tablet Take 1 tablet by mouth daily.   . carboxymethylcellulose 1 % ophthalmic solution Place 1 drop into both eyes 2 (two) times daily.  . Cranberry-Vitamin C-Inulin (UTI-STAT PO) Take 30 mLs by mouth 2 (two) times daily.  . divalproex (DEPAKOTE) 125 MG DR tablet Take 125 mg by mouth 2 (two) times daily.   Marland Kitchen. docusate (COLACE) 50 MG/5ML liquid Take 5 ml by mouth daily.  . furosemide (LASIX) 20 MG tablet Take 30 mg by mouth daily. Give 1.5 tablets by mouth daily.  Marland Kitchen. losartan (COZAAR) 50 MG tablet Take 50 mg by mouth daily.    . Multiple Vitamins-Minerals (CENTRUM SILVER PO) Take 1 tablet by mouth daily.  . rivastigmine (EXELON) 9.5 mg/24hr Place 1 patch (9.5 mg total) onto the skin daily.  Marland Kitchen. senna-docusate (SENOKOT-S) 8.6-50 MG per tablet Take 2 tablets by mouth daily.  Marland Kitchen. UNABLE TO FIND Med Name: Med Pass intiate 4 oz three times a day  . UNABLE TO FIND Med Name: Magic Cup : give with lunch and dinner trays due to weight loss.  Marland Kitchen. UNABLE TO FIND Med Name: Ativan Gel  0.5 mg/ml : Apply 1 ml topically every evening at 4 pm daily for agitation   No  facility-administered encounter medications on file as of 04/04/2017.     Allergies (verified) Hydrocodone and Prednisone   History: Past Medical History:  Diagnosis Date  . Alzheimer's disease 02/25/2013  . Alzheimer's disease, unspecified (CODE)   . Anxiety disorder, unspecified   . Cellulitis of unspecified part of limb   . Constipated   . Dementia   . Depression 02/25/2013  . Diverticulosis of intestine without perforation or abscess without bleeding   . DNR (do not resuscitate)   . Dysphagia 04/02/2013  . Dysphagia, oropharyngeal phase   . Edema 04/02/2013  . Hordeolum internum of right lower eyelid 02/03/2015  . Hyperglycemia 03/06/2014  . Hypertension   . Major depressive disorder, single episode, unspecified   . Osteoarthritis   . Osteoporosis 06/10/2014  . Overactive bladder   . Vitamin D deficiency 05/09/2016   Past Surgical History:  Procedure Laterality Date  . ADENOIDECTOMY    . bilateral cataract surgery    . FOOT SURGERY    . FRACTURE SURGERY    . JOINT REPLACEMENT    . KNEE SURGERY    . TONSILLECTOMY     Family History  Problem Relation Age of Onset  . Diabetes Mother   . Hypertension Mother   . Coronary artery disease Mother  Social History   Occupational History  . Not on file.   Social History Main Topics  . Smoking status: Never Smoker  . Smokeless tobacco: Never Used  . Alcohol use No  . Drug use: No  . Sexual activity: No    Tobacco Counseling Counseling given: Not Answered   Activities of Daily Living In your present state of health, do you have any difficulty performing the following activities: 04/04/2017  Hearing? Y  Vision? N  Difficulty concentrating or making decisions? Y  Walking or climbing stairs? Y  Dressing or bathing? Y  Doing errands, shopping? Y  Preparing Food and eating ? Y  Using the Toilet? Y  In the past six months, have you accidently leaked urine? Y  Do you have problems with loss of bowel control? Y    Managing your Medications? Y  Managing your Finances? Y  Housekeeping or managing your Housekeeping? Y  Some recent data might be hidden    Immunizations and Health Maintenance Immunization History  Administered Date(s) Administered  . Influenza-Unspecified 07/06/2014, 06/18/2015  . Tdap 04/10/2012   There are no preventive care reminders to display for this patient.  Patient Care Team: Margit Hanks, MD as PCP - General (Internal Medicine)  Indicate any recent Medical Services you may have received from other than [facility] providers in the past year (date may be approximate).     Assessment:   This is a routine wellness examination for Dallie.   Hearing/Vision screen No exam data present  Dietary issues and exercise activities discussed: Current Exercise Habits: The patient does not participate in regular exercise at present, Exercise limited by: neurologic condition(s)  Goals    None     Depression Screen PHQ 2/9 Scores 04/04/2017  Exception Documentation Medical reason    Fall Risk Fall Risk  04/04/2017  Falls in the past year? Yes  Number falls in past yr: 2 or more  Injury with Fall? No    Cognitive Function: MMSE - Mini Mental State Exam 04/04/2017  Not completed: Unable to complete        Screening Tests Health Maintenance  Topic Date Due  . DEXA SCAN  09/19/2023 (Originally 12/24/1990)  . PNA vac Low Risk Adult (1 of 2 - PCV13) 09/19/2023 (Originally 12/24/1990)  . INFLUENZA VACCINE  04/18/2017  . TETANUS/TDAP  04/10/2022      Plan:    I have personally reviewed and addressed the Medicare Annual Wellness questionnaire and have noted the following in the patient's chart:  A. Medical and social history B. Use of alcohol, tobacco or illicit drugs  C. Current medications and supplements D. Functional ability and status E.  Nutritional status F.  Physical activity G. Advance directives H. List of other physicians I.  Hospitalizations, surgeries,  and ER visits in previous 12 months J.  Vitals K. Screenings to include hearing, vision, cognitive, depression L. Referrals and appointments - none  In addition, unable to review and discuss with incapacitated patient certain preventive protocols, quality metrics, and best practice recommendations. A written personalized care plan for preventive services as well as general preventive health recommendations were provided to patient.  See attached scanned questionnaire for additional information.   Signed,   Annetta Maw, RN Nurse Health Advisor   Quick Notes   Health Maintenance: DEXA and PNA 13 due     Abnormal Screen: unable to complete mental exam     Patient Concerns: None     Nurse Concerns: None

## 2017-04-05 ENCOUNTER — Encounter: Payer: Self-pay | Admitting: Internal Medicine

## 2017-04-05 DIAGNOSIS — Z87440 Personal history of urinary (tract) infections: Secondary | ICD-10-CM | POA: Insufficient documentation

## 2017-04-05 NOTE — Assessment & Plan Note (Signed)
Stable without recent infections; continue UTI stat 30 mils by mouth twice a day

## 2017-04-05 NOTE — Assessment & Plan Note (Signed)
Chronic and stable; continue Senokot S 2 by mouth daily

## 2017-04-05 NOTE — Assessment & Plan Note (Signed)
Stable; can decrease to 1000 units by mouth daily

## 2017-04-09 DIAGNOSIS — F0390 Unspecified dementia without behavioral disturbance: Secondary | ICD-10-CM | POA: Diagnosis not present

## 2017-04-09 DIAGNOSIS — F39 Unspecified mood [affective] disorder: Secondary | ICD-10-CM | POA: Diagnosis not present

## 2017-04-09 DIAGNOSIS — F411 Generalized anxiety disorder: Secondary | ICD-10-CM | POA: Diagnosis not present

## 2017-04-09 DIAGNOSIS — R69 Illness, unspecified: Secondary | ICD-10-CM | POA: Diagnosis not present

## 2017-04-24 DIAGNOSIS — F0390 Unspecified dementia without behavioral disturbance: Secondary | ICD-10-CM | POA: Diagnosis not present

## 2017-04-24 DIAGNOSIS — F411 Generalized anxiety disorder: Secondary | ICD-10-CM | POA: Diagnosis not present

## 2017-04-24 DIAGNOSIS — R69 Illness, unspecified: Secondary | ICD-10-CM | POA: Diagnosis not present

## 2017-04-24 DIAGNOSIS — F39 Unspecified mood [affective] disorder: Secondary | ICD-10-CM | POA: Diagnosis not present

## 2017-05-02 DIAGNOSIS — R69 Illness, unspecified: Secondary | ICD-10-CM | POA: Diagnosis not present

## 2017-05-02 DIAGNOSIS — R569 Unspecified convulsions: Secondary | ICD-10-CM | POA: Diagnosis not present

## 2017-05-03 ENCOUNTER — Encounter: Payer: Self-pay | Admitting: Internal Medicine

## 2017-05-03 NOTE — Assessment & Plan Note (Signed)
Controlled; plan to continue BuSpar 10 mg by mouth twice a day

## 2017-05-03 NOTE — Assessment & Plan Note (Signed)
Chronic but fairly stable, no major declines; plan to continue Exelon patch 9.5 mg daily

## 2017-05-03 NOTE — Assessment & Plan Note (Signed)
Controlled; continue Lasix 30 mg by mouth daily Cozaar 50 mg by mouth daily

## 2017-05-04 ENCOUNTER — Encounter: Payer: Self-pay | Admitting: Internal Medicine

## 2017-05-04 ENCOUNTER — Non-Acute Institutional Stay (SKILLED_NURSING_FACILITY): Payer: Medicare HMO | Admitting: Internal Medicine

## 2017-05-04 DIAGNOSIS — R05 Cough: Secondary | ICD-10-CM | POA: Diagnosis not present

## 2017-05-04 DIAGNOSIS — R059 Cough, unspecified: Secondary | ICD-10-CM

## 2017-05-04 NOTE — Progress Notes (Signed)
Location:   [facility] Service:   skilled nursing facility  Margit Hanks, MD  Patient Care Team: Margit Hanks, MD as PCP - General (Internal Medicine)  Extended Emergency Contact Information Primary Emergency Contact: Simmons,Tanya Address: [address]          [place], [place] [address] [place] Home Phone: [phone] Mobile Phone: [phone] Relation: Grandaughter Secondary Emergency Contact: Lenard Galloway Address: [address]          [place], [place] [address] [place] Home Phone: [phone] Relation: Daughter    Allergies: Hydrocodone and Prednisone  Chief Complaint  Patient presents with  . Acute Visit    HPI: Patient is 83 y.o. female who Who nursing asked me to see for call symptoms onset yesterday. Since the patient started coughing yesterday with increased coughing today. Nurses report no for her baseline mental status changes. Patient with dementia and cannot give any history. Procedure nothing makes coughing better nothing makes cough or worse.  Past Medical History:  Diagnosis Date  . Alzheimer's disease 02/25/2013  . Alzheimer's disease, unspecified (CODE)   . Anxiety disorder, unspecified   . Cellulitis of unspecified part of limb   . Constipated   . Dementia   . Depression 02/25/2013  . Diverticulosis of intestine without perforation or abscess without bleeding   . DNR (do not resuscitate)   . Dysphagia 04/02/2013  . Dysphagia, oropharyngeal phase   . Edema 04/02/2013  . Hordeolum internum of right lower eyelid 02/03/2015  . Hyperglycemia 03/06/2014  . Hypertension   . Major depressive disorder, single episode, unspecified   . Osteoarthritis   . Osteoporosis 06/10/2014  . Overactive bladder   . Vitamin D deficiency 05/09/2016    Past Surgical History:  Procedure Laterality Date  . ADENOIDECTOMY    . bilateral cataract surgery    . FOOT SURGERY    . FRACTURE SURGERY    . JOINT  REPLACEMENT    . KNEE SURGERY    . TONSILLECTOMY      Allergies as of 05/04/2017      Reactions   Hydrocodone    Prednisone       Medication List       Accurate as of 05/04/17 11:59 PM. Always use your most recent med list.          acetaminophen 325 MG tablet Commonly known as:  TYLENOL Take 2 tablets (650 mg) by mouth every 6 hours as needed for pain. Also take 2 tablets by mouth (650 mg) at bedtime. Do not exceed 4000 mg APAP in 24 hours.   busPIRone 10 MG tablet Commonly known as:  BUSPAR Take 10 mg by mouth 2 (two) times daily.   calcium-vitamin D 500-200 MG-UNIT tablet Commonly known as:  OSCAL WITH D Take 1 tablet by mouth daily.   carboxymethylcellulose 1 % ophthalmic solution Place 1 drop into both eyes 2 (two) times daily.   CENTRUM SILVER PO Take 1 tablet by mouth daily.   divalproex 125 MG DR tablet Commonly known as:  DEPAKOTE Take 125 mg by mouth 2 (two) times daily.   docusate 50 MG/5ML liquid Commonly known as:  COLACE Take 5 ml by mouth daily.   furosemide 20 MG tablet Commonly known as:  LASIX Take 30 mg by mouth daily. Give 1.5 tablets by mouth daily.   losartan 50 MG tablet Commonly known as:  COZAAR Take 50 mg by mouth daily.  rivastigmine 9.5 mg/24hr Commonly known as:  EXELON Place 1 patch (9.5 mg total) onto the skin daily.   senna-docusate 8.6-50 MG tablet Commonly known as:  Senokot-S Take 2 tablets by mouth daily.   UNABLE TO FIND Med Name: Ativan Gel  0.5 mg/ml : Apply 1 ml topically every evening at 4 pm daily for agitation   UNABLE TO FIND Med Name: Med Pass intiate 4 oz three times a day   UNABLE TO FIND Med Name: Magic Cup : give with lunch and dinner trays due to weight loss.   UTI-STAT PO Take 30 mLs by mouth 2 (two) times daily.       No orders of the defined types were placed in this encounter.   Immunization History  Administered Date(s) Administered  . Influenza-Unspecified 07/06/2014, 06/18/2015  .  Tdap 04/10/2012    Social History  Substance Use Topics  . Smoking status: Never Smoker  . Smokeless tobacco: Never Used  . Alcohol use No    Review of Systems-Unable to obtain secondary to dementia; nursing and Center-as per history of present illness    Vitals:   05/04/17 1006  BP: (!) 93/59  Pulse: 76  Resp: 18  Temp: (!) 97.5 F (36.4 C)   Body mass index is 21.26 kg/m. Physical Exam  GENERAL APPEARANCE: Alert, conversant, No acute distress  SKIN: No diaphoresis rash HEENT: Unremarkable RESPIRATORY: Breathing is even, unlabored. Lung sounds are clear ; bedside O2 sat 90-94% on room air  CARDIOVASCULAR: Heart RRR no murmurs, rubs or gallops. No peripheral edema  GASTROINTESTINAL: Abdomen is soft, non-tender, not distended w/ normal bowel sounds.  GENITOURINARY: Bladder non tender, not distended  MUSCULOSKELETAL: No abnormal joints or musculature NEUROLOGIC: Cranial nerves 2-12 grossly intact. Moves all extremities PSYCHIATRIC: Mood and affect with dementia, no behavioral issues  Patient Active Problem List   Diagnosis Date Noted  . History of recurrent UTIs 04/05/2017  . Mood disorder (HCC) 06/11/2016  . Bilateral lower extremity edema 06/11/2016  . Vitamin D deficiency 05/09/2016  . Anxiety 05/09/2016  . Infection due to ESBL-producing Escherichia coli 02/11/2016  . Elbow pain 01/27/2016  . Cerumen impaction 12/30/2015  . Contusion 12/20/2015  . UTI (urinary tract infection) 06/07/2015  . Hordeolum internum of right lower eyelid 02/03/2015  . Osteoporosis 06/10/2014  . Hyperglycemia 03/06/2014  . Dermatitis 03/06/2014  . Chronic venous insufficiency 12/08/2013  . Altered mental status 10/20/2013  . Complicated UTI (urinary tract infection) 05/01/2013  . Edema 04/02/2013  . Dysphagia 04/02/2013  . Depression 02/25/2013  . Constipation 02/25/2013  . Essential hypertension, benign 02/25/2013  . Alzheimer's disease 02/25/2013    CMP     Component  Value Date/Time   NA 143 11/30/2016   K 5.1 11/30/2016   CL 94 (L) 06/02/2012 0950   CO2 31 06/02/2012 0950   GLUCOSE 93 06/02/2012 0950   BUN 18 11/30/2016   CREATININE 0.5 11/30/2016   CREATININE 0.60 06/02/2012 0950   CALCIUM 9.4 06/02/2012 0950   PROT 6.4 06/02/2012 0950   ALBUMIN 3.6 06/02/2012 0950   AST 15 11/30/2016   ALT 7 11/30/2016   ALKPHOS 50 11/30/2016   BILITOT 0.5 06/02/2012 0950   GFRNONAA 80 (L) 06/02/2012 0950   GFRAA >90 06/02/2012 0950    Recent Labs  06/09/16 11/30/16  NA 139 143  K 4.2 5.1  BUN 18 18  CREATININE 0.7 0.5    Recent Labs  06/09/16 11/30/16  AST 20 15  ALT 9 7  ALKPHOS 58 50    Recent Labs  06/09/16 11/18/16 11/30/16  WBC 8.1 6.6 6.9  HGB 14.1 13.5 14.3  HCT 42 40 43  PLT 241 193 197    Recent Labs  06/09/16 11/30/16  CHOL 195 181  LDLCALC 143 127  TRIG 71 65   Lab Results  Component Value Date   MICROALBUR 82 01/31/2016   MICROALBUR 4.7 01/31/2016   Lab Results  Component Value Date   TSH 1.31 11/30/2016   Lab Results  Component Value Date   HGBA1C 5.3 11/30/2016   Lab Results  Component Value Date   CHOL 181 11/30/2016   HDL 41 11/30/2016   LDLCALC 127 11/30/2016   TRIG 65 11/30/2016    Significant Diagnostic Results in last 30 days:  No results found.  Assessment and Plan  COUGH-patient will be started on Mucinex sprinkles 400 mg 3 times a day for 7 days scheduled; patient does not look sick enough to have pneumonia however O2 sat was a little bit on the low side therefore have ordered chest x-ray PA and lateral; we will monitor     Edward Guthmiller D. Lyn Hollingshead, MD

## 2017-05-05 ENCOUNTER — Encounter: Payer: Self-pay | Admitting: Internal Medicine

## 2017-05-07 ENCOUNTER — Non-Acute Institutional Stay (SKILLED_NURSING_FACILITY): Payer: Medicare HMO | Admitting: Internal Medicine

## 2017-05-07 ENCOUNTER — Encounter: Payer: Self-pay | Admitting: Internal Medicine

## 2017-05-07 DIAGNOSIS — F329 Major depressive disorder, single episode, unspecified: Secondary | ICD-10-CM

## 2017-05-07 DIAGNOSIS — M810 Age-related osteoporosis without current pathological fracture: Secondary | ICD-10-CM

## 2017-05-07 DIAGNOSIS — R69 Illness, unspecified: Secondary | ICD-10-CM | POA: Diagnosis not present

## 2017-05-07 DIAGNOSIS — F39 Unspecified mood [affective] disorder: Secondary | ICD-10-CM

## 2017-05-07 DIAGNOSIS — F32A Depression, unspecified: Secondary | ICD-10-CM

## 2017-05-07 NOTE — Progress Notes (Signed)
Location:  [facility] Room Number: 319 Place of Service:  SNF [phone])  Margit Hanks, MD  Patient Care Team: Margit Hanks, MD as PCP - General (Internal Medicine)  Extended Emergency Contact Information Primary Emergency Contact: Simmons,Tanya Address: [address]          [place], [place] [address] [place] Home Phone: [phone] Mobile Phone: [phone] Relation: Grandaughter Secondary Emergency Contact: Lenard Galloway Address: [address]          [place], [place] [address] [place] Home Phone: [phone] Relation: Daughter    Allergies: Hydrocodone and Prednisone  Chief Complaint  Patient presents with  . Medical Management of Chronic Issues    Routine Visit    HPI: Patient is 83 y.o. female who Is being seen for routine issues of mood disorder, depression, and osteoporosis.  Past Medical History:  Diagnosis Date  . Alzheimer's disease 02/25/2013  . Alzheimer's disease, unspecified (CODE)   . Anxiety disorder, unspecified   . Cellulitis of unspecified part of limb   . Constipated   . Dementia   . Depression 02/25/2013  . Diverticulosis of intestine without perforation or abscess without bleeding   . DNR (do not resuscitate)   . Dysphagia 04/02/2013  . Dysphagia, oropharyngeal phase   . Edema 04/02/2013  . Hordeolum internum of right lower eyelid 02/03/2015  . Hyperglycemia 03/06/2014  . Hypertension   . Major depressive disorder, single episode, unspecified   . Osteoarthritis   . Osteoporosis 06/10/2014  . Overactive bladder   . Vitamin D deficiency 05/09/2016    Past Surgical History:  Procedure Laterality Date  . ADENOIDECTOMY    . bilateral cataract surgery    . FOOT SURGERY    . FRACTURE SURGERY    . JOINT REPLACEMENT    . KNEE SURGERY    . TONSILLECTOMY      Allergies as of 05/07/2017      Reactions   Hydrocodone    Prednisone       Medication List         Accurate as of 05/07/17 11:59 PM. Always use your most recent med list.          acetaminophen 325 MG tablet Commonly known as:  TYLENOL Take 2 tablets (650 mg) by mouth every 6 hours as needed for pain. Also take 2 tablets by mouth (650 mg) at bedtime. Do not exceed 4000 mg APAP in 24 hours.   busPIRone 10 MG tablet Commonly known as:  BUSPAR Take 10 mg by mouth 2 (two) times daily.   calcium-vitamin D 500-200 MG-UNIT tablet Commonly known as:  OSCAL WITH D Take 1 tablet by mouth daily.   carboxymethylcellulose 1 % ophthalmic solution Place 1 drop into both eyes 2 (two) times daily.   CENTRUM SILVER PO Take 1 tablet by mouth daily.   divalproex 125 MG DR tablet Commonly known as:  DEPAKOTE Take 125 mg by mouth 2 (two) times daily.   docusate 50 MG/5ML liquid Commonly known as:  COLACE Take 5 ml by mouth daily.   furosemide 20 MG tablet Commonly known as:  LASIX Take 30 mg by mouth daily. Give 1.5 tablets by mouth daily.   losartan 50 MG tablet Commonly known as:  COZAAR Take 50 mg by mouth daily.   rivastigmine 9.5 mg/24hr Commonly known as:  EXELON Place 1 patch (9.5 mg total) onto the skin daily.   senna-docusate 8.6-50 MG  tablet Commonly known as:  Senokot-S Take 2 tablets by mouth daily.   UNABLE TO FIND Med Name: Ativan Gel  0.5 mg/ml : Apply 1 ml topically every evening at 4 pm daily for agitation   UNABLE TO FIND Med Name: Med Pass intiate 4 oz four times a day   UNABLE TO FIND Med Name: Magic Cup : give with lunch and dinner trays due to weight loss.   UTI-STAT PO Take 30 mLs by mouth 2 (two) times daily.       No orders of the defined types were placed in this encounter.   Immunization History  Administered Date(s) Administered  . Influenza-Unspecified 07/06/2014, 06/18/2015  . Tdap 04/10/2012    Social History  Substance Use Topics  . Smoking status: Never Smoker  . Smokeless tobacco: Never Used  . Alcohol use No    Review of  Systems Unable to obtain secondary to dementia; nursing-no concerns    Vitals:   05/07/17 0930  BP: (!) 93/59  Pulse: 76  Resp: 18  Temp: (!) 97.5 F (36.4 C)   Body mass index is 21.26 kg/m. Physical Exam  GENERAL APPEARANCE: Alert, conversant, No acute distress  SKIN: No diaphoresis rash HEENT: Unremarkable RESPIRATORY: Breathing is even, unlabored. Lung sounds are clear   CARDIOVASCULAR: Heart RRR no murmurs, rubs or gallops. No peripheral edema  GASTROINTESTINAL: Abdomen is soft, non-tender, not distended w/ normal bowel sounds.  GENITOURINARY: Bladder non tender, not distended  MUSCULOSKELETAL:: No abnormal joints or musculature NEUROLOGIC: Cranial nerves 2-12 grossly intact. Moves all extremities PSYCHIATRIC: Dementia, no behavioral issues  Physical examination has not changed since prior visit.   Patient Active Problem List   Diagnosis Date Noted  . History of recurrent UTIs 04/05/2017  . Mood disorder (HCC) 06/11/2016  . Bilateral lower extremity edema 06/11/2016  . Vitamin D deficiency 05/09/2016  . Anxiety 05/09/2016  . Infection due to ESBL-producing Escherichia coli 02/11/2016  . Elbow pain 01/27/2016  . Cerumen impaction 12/30/2015  . Contusion 12/20/2015  . UTI (urinary tract infection) 06/07/2015  . Hordeolum internum of right lower eyelid 02/03/2015  . Osteoporosis 06/10/2014  . Hyperglycemia 03/06/2014  . Dermatitis 03/06/2014  . Chronic venous insufficiency 12/08/2013  . Altered mental status 10/20/2013  . Complicated UTI (urinary tract infection) 05/01/2013  . Edema 04/02/2013  . Dysphagia 04/02/2013  . Depression 02/25/2013  . Constipation 02/25/2013  . Essential hypertension, benign 02/25/2013  . Alzheimer's disease 02/25/2013    CMP     Component Value Date/Time   NA 143 11/30/2016   K 5.1 11/30/2016   CL 94 (L) 06/02/2012 0950   CO2 31 06/02/2012 0950   GLUCOSE 93 06/02/2012 0950   BUN 18 11/30/2016   CREATININE 0.5 11/30/2016     CREATININE 0.60 06/02/2012 0950   CALCIUM 9.4 06/02/2012 0950   PROT 6.4 06/02/2012 0950   ALBUMIN 3.6 06/02/2012 0950   AST 15 11/30/2016   ALT 7 11/30/2016   ALKPHOS 50 11/30/2016   BILITOT 0.5 06/02/2012 0950   GFRNONAA 80 (L) 06/02/2012 0950   GFRAA >90 06/02/2012 0950    Recent Labs  06/09/16 11/30/16  NA 139 143  K 4.2 5.1  BUN 18 18  CREATININE 0.7 0.5    Recent Labs  06/09/16 11/30/16  AST 20 15  ALT 9 7  ALKPHOS 58 50    Recent Labs  06/09/16 11/18/16 11/30/16  WBC 8.1 6.6 6.9  HGB 14.1 13.5 14.3  HCT 42 40 43  PLT 241 193 197    Recent Labs  06/09/16 11/30/16  CHOL 195 181  LDLCALC 143 127  TRIG 71 65   Lab Results  Component Value Date   MICROALBUR 82 01/31/2016   MICROALBUR 4.7 01/31/2016   Lab Results  Component Value Date   TSH 1.31 11/30/2016   Lab Results  Component Value Date   HGBA1C 5.3 11/30/2016   Lab Results  Component Value Date   CHOL 181 11/30/2016   HDL 41 11/30/2016   LDLCALC 127 11/30/2016   TRIG 65 11/30/2016    Significant Diagnostic Results in last 30 days:  No results found.  Assessment and Plan  Mood disorder (HCC) Stable; plan to continue Depakote 125 mg DR twice a day  Depression Patient continues to do well with daily completely off of Lexapro; will monitor moods  Osteoporosis Stable, with no fractures; plan to continue Os-Cal plus D 500-200 by mouth daily     Anne D. Lyn Hollingshead, MD

## 2017-05-17 DIAGNOSIS — R69 Illness, unspecified: Secondary | ICD-10-CM | POA: Diagnosis not present

## 2017-05-17 DIAGNOSIS — F331 Major depressive disorder, recurrent, moderate: Secondary | ICD-10-CM | POA: Diagnosis not present

## 2017-05-17 DIAGNOSIS — F0390 Unspecified dementia without behavioral disturbance: Secondary | ICD-10-CM | POA: Diagnosis not present

## 2017-05-17 DIAGNOSIS — F411 Generalized anxiety disorder: Secondary | ICD-10-CM | POA: Diagnosis not present

## 2017-05-24 ENCOUNTER — Encounter: Payer: Self-pay | Admitting: Internal Medicine

## 2017-05-24 ENCOUNTER — Non-Acute Institutional Stay (SKILLED_NURSING_FACILITY): Payer: Medicare HMO | Admitting: Internal Medicine

## 2017-05-24 DIAGNOSIS — S8991XA Unspecified injury of right lower leg, initial encounter: Secondary | ICD-10-CM

## 2017-05-24 DIAGNOSIS — M25562 Pain in left knee: Secondary | ICD-10-CM | POA: Diagnosis not present

## 2017-05-24 NOTE — Progress Notes (Signed)
Location:  [facility] Room Number: 319 Place of Service:  SNF ([phone])  Margit HanksAlexander, Anne D, MD  Patient Care Team: Margit HanksAlexander, Anne D, MD as PCP - General (Internal Medicine)  Extended Emergency Contact Information Primary Emergency Contact: Simmons,Tanya Address: [address]          [place], [place] [address] [place] Home Phone: [phone] Mobile Phone: [phone] Relation: Grandaughter Secondary Emergency Contact: Lenard GallowaySimmons,Patricia Ann Address: [address]          [place], [place] [address] [place] Home Phone: [phone] Relation: Daughter    Allergies: Hydrocodone and Prednisone  Chief Complaint  Patient presents with  . Acute Visit    bruise inner upper left leg    HPI: Patient is 83 y.o. female who Nursing asked me to see for a large bruise on her knee. Apparently she hit it on a dresser when she was moving about the room. Patient with dementia, she complains of pain and can respond to painful stimuli but really cannot give any history.  Past Medical History:  Diagnosis Date  . Alzheimer's disease 02/25/2013  . Alzheimer's disease, unspecified (CODE)   . Anxiety disorder, unspecified   . Cellulitis of unspecified part of limb   . Constipated   . Dementia   . Depression 02/25/2013  . Diverticulosis of intestine without perforation or abscess without bleeding   . DNR (do not resuscitate)   . Dysphagia 04/02/2013  . Dysphagia, oropharyngeal phase   . Edema 04/02/2013  . Hordeolum internum of right lower eyelid 02/03/2015  . Hyperglycemia 03/06/2014  . Hypertension   . Major depressive disorder, single episode, unspecified   . Osteoarthritis   . Osteoporosis 06/10/2014  . Overactive bladder   . Vitamin D deficiency 05/09/2016    Past Surgical History:  Procedure Laterality Date  . ADENOIDECTOMY    . bilateral cataract surgery    . FOOT SURGERY    . FRACTURE SURGERY    . JOINT REPLACEMENT    .  KNEE SURGERY    . TONSILLECTOMY      Allergies as of 05/24/2017      Reactions   Hydrocodone    Prednisone       Medication List       Accurate as of 05/24/17 10:02 AM. Always use your most recent med list.          acetaminophen 325 MG tablet Commonly known as:  TYLENOL Take 2 tablets (650 mg) by mouth every 6 hours as needed for pain. Also take 2 tablets by mouth (650 mg) at bedtime. Do not exceed 4000 mg APAP in 24 hours.   busPIRone 10 MG tablet Commonly known as:  BUSPAR Take 10 mg by mouth 2 (two) times daily.   calcium-vitamin D 500-200 MG-UNIT tablet Commonly known as:  OSCAL WITH D Take 1 tablet by mouth daily.   carboxymethylcellulose 1 % ophthalmic solution Place 1 drop into both eyes 2 (two) times daily.   CENTRUM SILVER PO Take 1 tablet by mouth daily.   divalproex 125 MG DR tablet Commonly known as:  DEPAKOTE Take 125 mg by mouth 2 (two) times daily.   docusate 50 MG/5ML liquid Commonly known as:  COLACE Take 5 ml by mouth daily.   furosemide 20 MG tablet Commonly known as:  LASIX Take 30 mg by mouth daily. Give 1.5 tablets by mouth daily.   losartan 50 MG tablet Commonly known as:  COZAAR Take 50 mg by mouth daily.   rivastigmine 9.5 mg/24hr Commonly known as:  EXELON Place 1 patch (9.5 mg total) onto the skin daily.   senna-docusate 8.6-50 MG tablet Commonly known as:  Senokot-S Take 2 tablets by mouth daily.   UNABLE TO FIND Med Name: Ativan Gel  0.5 mg/ml : Apply 1 ml topically every evening at 4 pm daily for agitation   UNABLE TO FIND Med Name: Med Pass intiate 4 oz four times a day   UNABLE TO FIND Med Name: Magic Cup : give with lunch and dinner trays due to weight loss.   UTI-STAT PO Take 30 mLs by mouth 2 (two) times daily.       No orders of the defined types were placed in this encounter.   Immunization History  Administered Date(s) Administered  . Influenza-Unspecified 07/06/2014, 06/18/2015  . Tdap 04/10/2012     Social History  Substance Use Topics  . Smoking status: Never Smoker  . Smokeless tobacco: Never Used  . Alcohol use No    Review of SystemsUnable to obtain secondary to dementia; nurse-as per history of present illness    Vitals:   05/24/17 0956  BP: (!) 93/59  Pulse: 72  Resp: 18  Temp: (!) 96.9 F (36.1 C)   Body mass index is 20.9 kg/m. Physical Exam  GENERAL APPEARANCE: Alert, conversant, No acute distress  SKIN: No diaphoresis rash HEENT: Unremarkable RESPIRATORY: Breathing is even, unlabored. Lung sounds are clear   CARDIOVASCULAR: Heart RRR 1/6 systolic murmur; no rubs or gallops. Bilateral legs are wrapped GASTROINTESTINAL: Abdomen is soft, non-tender, not distended w/ normal bowel sounds.  GENITOURINARY: Bladder non tender, not distended  MUSCULOSKELETAL: Large bruise medial knee; no joint line tenderness no tenderness to palpation or movement NEUROLOGIC: Cranial nerves 2-12 grossly intact. Moves all extremities PSYCHIATRIC: Dementia, no behavioral issues  Patient Active Problem List   Diagnosis Date Noted  . History of recurrent UTIs 04/05/2017  . Mood disorder (HCC) 06/11/2016  . Bilateral lower extremity edema 06/11/2016  . Vitamin D deficiency 05/09/2016  . Anxiety 05/09/2016  . Infection due to ESBL-producing Escherichia coli 02/11/2016  . Elbow pain 01/27/2016  . Cerumen impaction 12/30/2015  . Contusion 12/20/2015  . UTI (urinary tract infection) 06/07/2015  . Hordeolum internum of right lower eyelid 02/03/2015  . Osteoporosis 06/10/2014  . Hyperglycemia 03/06/2014  . Dermatitis 03/06/2014  . Chronic venous insufficiency 12/08/2013  . Altered mental status 10/20/2013  . Complicated UTI (urinary tract infection) 05/01/2013  . Edema 04/02/2013  . Dysphagia 04/02/2013  . Depression 02/25/2013  . Constipation 02/25/2013  . Essential hypertension, benign 02/25/2013  . Alzheimer's disease 02/25/2013    CMP     Component Value Date/Time    NA 143 11/30/2016   K 5.1 11/30/2016   CL 94 (L) 06/02/2012 0950   CO2 31 06/02/2012 0950   GLUCOSE 93 06/02/2012 0950   BUN 18 11/30/2016   CREATININE 0.5 11/30/2016   CREATININE 0.60 06/02/2012 0950   CALCIUM 9.4 06/02/2012 0950   PROT 6.4 06/02/2012 0950   ALBUMIN 3.6 06/02/2012 0950   AST 15 11/30/2016   ALT 7 11/30/2016   ALKPHOS 50 11/30/2016   BILITOT 0.5 06/02/2012 0950   GFRNONAA 80 (L) 06/02/2012 0950   GFRAA >90 06/02/2012 0950    Recent Labs  06/09/16 11/30/16  NA 139 143  K 4.2 5.1  BUN 18 18  CREATININE 0.7 0.5    Recent Labs  06/09/16 11/30/16  AST  20 15  ALT 9 7  ALKPHOS 58 50    Recent Labs  06/09/16 11/18/16 11/30/16  WBC 8.1 6.6 6.9  HGB 14.1 13.5 14.3  HCT 42 40 43  PLT 241 193 197    Recent Labs  06/09/16 11/30/16  CHOL 195 181  LDLCALC 143 127  TRIG 71 65   Lab Results  Component Value Date   MICROALBUR 82 01/31/2016   MICROALBUR 4.7 01/31/2016   Lab Results  Component Value Date   TSH 1.31 11/30/2016   Lab Results  Component Value Date   HGBA1C 5.3 11/30/2016   Lab Results  Component Value Date   CHOL 181 11/30/2016   HDL 41 11/30/2016   LDLCALC 127 11/30/2016   TRIG 65 11/30/2016    Significant Diagnostic Results in last 30 days:  No results found.  Assessment and Plan  BRUISE RIGHT KNEE-there appears to me no structural or soft tissue damage; left knee x-ray is negative for fracture; will continue to monitor     Margit Hanks, MD

## 2017-05-28 DIAGNOSIS — H6123 Impacted cerumen, bilateral: Secondary | ICD-10-CM | POA: Diagnosis not present

## 2017-06-02 ENCOUNTER — Encounter: Payer: Self-pay | Admitting: Internal Medicine

## 2017-06-02 NOTE — Assessment & Plan Note (Signed)
Stable; plan to continue Depakote 125 mg DR twice a day

## 2017-06-02 NOTE — Assessment & Plan Note (Signed)
Stable, with no fractures; plan to continue Os-Cal plus D 500-200 by mouth daily

## 2017-06-02 NOTE — Assessment & Plan Note (Signed)
Patient continues to do well with daily completely off of Lexapro; will monitor moods

## 2017-06-05 ENCOUNTER — Non-Acute Institutional Stay (SKILLED_NURSING_FACILITY): Payer: Medicare HMO | Admitting: Internal Medicine

## 2017-06-05 ENCOUNTER — Encounter: Payer: Self-pay | Admitting: Internal Medicine

## 2017-06-05 DIAGNOSIS — D649 Anemia, unspecified: Secondary | ICD-10-CM | POA: Diagnosis not present

## 2017-06-05 DIAGNOSIS — Z66 Do not resuscitate: Secondary | ICD-10-CM

## 2017-06-05 DIAGNOSIS — I10 Essential (primary) hypertension: Secondary | ICD-10-CM | POA: Diagnosis not present

## 2017-06-05 DIAGNOSIS — J189 Pneumonia, unspecified organism: Secondary | ICD-10-CM

## 2017-06-05 NOTE — Progress Notes (Signed)
Location:  [facility] Room Number: 319 Place of Service:  SNF [phone])  Margit Hanks, MD  Patient Care Team: Margit Hanks, MD as PCP - General (Internal Medicine)  Extended Emergency Contact Information Primary Emergency Contact: Simmons,Tanya Address: [address]          [place], [place] [address] [place] Home Phone: [phone] Mobile Phone: [phone] Relation: Grandaughter Secondary Emergency Contact: Lenard Galloway Address: [address]          [place], [place] [address] [place] Home Phone: [phone] Relation: Daughter    Allergies: Hydrocodone and Prednisone  Chief Complaint  Patient presents with  . Acute Visit    possible pneumonia    HPI: Patient is 83 y.o. female who ho is being seen acutely for a change in status. Patient appears ill; O2 saturation is 88% on room air, improved with 2 L nasal cannula; decreased breath sounds right anterior chest with loud breath sounds on the left. Patient cannot give any history secondary to dementia. Per nursing this was notedtoday. Patient appears a little bit better with oxygen.  Past Medical History:  Diagnosis Date  . Alzheimer's disease 02/25/2013  . Alzheimer's disease, unspecified (CODE)   . Anxiety disorder, unspecified   . Cellulitis of unspecified part of limb   . Constipated   . Dementia   . Depression 02/25/2013  . Diverticulosis of intestine without perforation or abscess without bleeding   . DNR (do not resuscitate)   . Dysphagia 04/02/2013  . Dysphagia, oropharyngeal phase   . Edema 04/02/2013  . Hordeolum internum of right lower eyelid 02/03/2015  . Hyperglycemia 03/06/2014  . Hypertension   . Major depressive disorder, single episode, unspecified   . Osteoarthritis   . Osteoporosis 06/10/2014  . Overactive bladder   . Vitamin D deficiency 05/09/2016    Past Surgical History:  Procedure Laterality Date  . ADENOIDECTOMY      . bilateral cataract surgery    . FOOT SURGERY    . FRACTURE SURGERY    . JOINT REPLACEMENT    . KNEE SURGERY    . TONSILLECTOMY      Allergies as of 06/05/2017      Reactions   Hydrocodone    Prednisone       Medication List       Accurate as of 06/05/17  2:41 PM. Always use your most recent med list.          acetaminophen 325 MG tablet Commonly known as:  TYLENOL Take 2 tablets (650 mg) by mouth every 6 hours as needed for pain. Also take 2 tablets by mouth (650 mg) at bedtime. Do not exceed 4000 mg APAP in 24 hours.   calcium-vitamin D 500-200 MG-UNIT tablet Commonly known as:  OSCAL WITH D Take 1 tablet by mouth daily.   carboxymethylcellulose 1 % ophthalmic solution Place 1 drop into both eyes 2 (two) times daily.   CENTRUM SILVER PO Take 1 tablet by mouth daily.   divalproex 125 MG DR tablet Commonly known as:  DEPAKOTE Take 125 mg by mouth. Take one tablet daily. Take one tablet at bedtime   furosemide 20 MG tablet Commonly known as:  LASIX Take 30 mg by mouth daily. Give 1.5 tablets by mouth daily.   losartan 50 MG tablet Commonly known as:  COZAAR Take 50 mg by mouth daily.   rivastigmine 9.5 mg/24hr Commonly known as:  EXELON Place  1 patch (9.5 mg total) onto the skin daily.   senna-docusate 8.6-50 MG tablet Commonly known as:  Senokot-S Take 2 tablets by mouth daily.   UNABLE TO FIND Med Name: Ativan Gel  0.5 mg/ml : Apply 1 ml topically every evening at 4 pm daily for agitation   UNABLE TO FIND Med Name: Med Pass intiate 4 oz four times a day   UTI-STAT PO Take 30 mLs by mouth 2 (two) times daily.       No orders of the defined types were placed in this encounter.   Immunization History  Administered Date(s) Administered  . Influenza-Unspecified 07/06/2014, 06/18/2015  . Tdap 04/10/2012    Social History  Substance Use Topics  . Smoking status: Never Smoker  . Smokeless tobacco: Never Used  . Alcohol use No    Review of  Systems  Unable to obtain secondary to dementia; nursing-as per history of present illness   Vitals:   06/05/17 1032  BP: (!) 93/59  Pulse: 60  Resp: 18  Temp: (!) 96.9 F (36.1 C)   Body mass index is 20.65 kg/m. Physical Exam  GENERAL APPEARANCE: awake, non-conversant, SKIN: No diaphoresis rash HEENT: Unremarkable RESPIRATORY: Breathing is even, mild work. Lung sounds are decreased on the right and a very loud on the left base; O2 sat a bedside is 88% on room air,with 2 L nasal cannula O2 sat improved to greater than 90%   CARDIOVASCULAR: Heart RRR no murmurs, rubs or gallops. No peripheral edema  GASTROINTESTINAL: Abdomen is soft, non-tender, not distended w/ normal bowel sounds.  GENITOURINARY: Bladder non tender, not distended  MUSCULOSKELETAL: No abnormal joints or musculature NEUROLOGIC: Cranial nerves 2-12 grossly intact. Moves all extremities PSYCHIATRIC: dementia, no behavioral issues  Patient Active Problem List   Diagnosis Date Noted  . History of recurrent UTIs 04/05/2017  . Mood disorder (HCC) 06/11/2016  . Bilateral lower extremity edema 06/11/2016  . Vitamin D deficiency 05/09/2016  . Anxiety 05/09/2016  . Infection due to ESBL-producing Escherichia coli 02/11/2016  . Elbow pain 01/27/2016  . Cerumen impaction 12/30/2015  . Contusion 12/20/2015  . UTI (urinary tract infection) 06/07/2015  . Hordeolum internum of right lower eyelid 02/03/2015  . Osteoporosis 06/10/2014  . Hyperglycemia 03/06/2014  . Dermatitis 03/06/2014  . Chronic venous insufficiency 12/08/2013  . Altered mental status 10/20/2013  . Complicated UTI (urinary tract infection) 05/01/2013  . Edema 04/02/2013  . Dysphagia 04/02/2013  . Depression 02/25/2013  . Constipation 02/25/2013  . Essential hypertension, benign 02/25/2013  . Alzheimer's disease 02/25/2013    CMP     Component Value Date/Time   NA 143 11/30/2016   K 5.1 11/30/2016   CL 94 (L) 06/02/2012 0950   CO2 31  06/02/2012 0950   GLUCOSE 93 06/02/2012 0950   BUN 18 11/30/2016   CREATININE 0.5 11/30/2016   CREATININE 0.60 06/02/2012 0950   CALCIUM 9.4 06/02/2012 0950   PROT 6.4 06/02/2012 0950   ALBUMIN 3.6 06/02/2012 0950   AST 15 11/30/2016   ALT 7 11/30/2016   ALKPHOS 50 11/30/2016   BILITOT 0.5 06/02/2012 0950   GFRNONAA 80 (L) 06/02/2012 0950   GFRAA >90 06/02/2012 0950    Recent Labs  06/09/16 11/30/16  NA 139 143  K 4.2 5.1  BUN 18 18  CREATININE 0.7 0.5    Recent Labs  06/09/16 11/30/16  AST 20 15  ALT 9 7  ALKPHOS 58 50    Recent Labs  06/09/16 11/18/16 11/30/16  WBC 8.1 6.6 6.9  HGB 14.1 13.5 14.3  HCT 42 40 43  PLT 241 193 197    Recent Labs  06/09/16 11/30/16  CHOL 195 181  LDLCALC 143 127  TRIG 71 65   Lab Results  Component Value Date   MICROALBUR 82 01/31/2016   MICROALBUR 4.7 01/31/2016   Lab Results  Component Value Date   TSH 1.31 11/30/2016   Lab Results  Component Value Date   HGBA1C 5.3 11/30/2016   Lab Results  Component Value Date   CHOL 181 11/30/2016   HDL 41 11/30/2016   LDLCALC 127 11/30/2016   TRIG 65 11/30/2016    Significant Diagnostic Results in last 30 days:  No results found.  Assessment and Plan  HCAPNEUMONIA -with desaturation, and abnormal breath sounds; have ordered BMP, CBC and chest x-ray PA and lateral  Later entry-chest x-ray returns with infiltrate left base; written for antibiotic injection Later entry-family does not wish to treat; patient will supported with comfort care, O2 for comfort, will DC antibiotics     Randon Goldsmith. Lyn Hollingshead, MD

## 2017-06-07 ENCOUNTER — Non-Acute Institutional Stay (SKILLED_NURSING_FACILITY): Payer: Medicare HMO | Admitting: Internal Medicine

## 2017-06-07 DIAGNOSIS — Z515 Encounter for palliative care: Secondary | ICD-10-CM

## 2017-06-13 DIAGNOSIS — R69 Illness, unspecified: Secondary | ICD-10-CM | POA: Diagnosis not present

## 2017-06-13 DIAGNOSIS — F0390 Unspecified dementia without behavioral disturbance: Secondary | ICD-10-CM | POA: Diagnosis not present

## 2017-06-13 DIAGNOSIS — F39 Unspecified mood [affective] disorder: Secondary | ICD-10-CM | POA: Diagnosis not present

## 2017-06-13 DIAGNOSIS — F411 Generalized anxiety disorder: Secondary | ICD-10-CM | POA: Diagnosis not present

## 2017-06-18 DEATH — deceased

## 2017-06-30 ENCOUNTER — Encounter: Payer: Self-pay | Admitting: Internal Medicine

## 2017-07-29 ENCOUNTER — Encounter: Payer: Self-pay | Admitting: Internal Medicine

## 2017-08-05 ENCOUNTER — Encounter: Payer: Self-pay | Admitting: Internal Medicine

## 2017-08-05 NOTE — Progress Notes (Signed)
Location:   [facility] Service:   skilled nursing facility  Margit Hanks, MD  Patient Care Team: Margit Hanks, MD as PCP - General (Internal Medicine)  Extended Emergency Contact Information Primary Emergency Contact: Simmons,Tanya Address: [address]          [place], [place] [address] [place] Home Phone: [phone] Mobile Phone: [phone] Relation: Grandaughter Secondary Emergency Contact: Lenard Galloway Address: [address]          [place], [place] [address] [place] Home Phone: [phone] Relation: Daughter    Allergies: Hydrocodone and Prednisone  Chief Complaint  Patient presents with  . Acute Visit    HPI: Patient is 83 y.o. female who is being seen because she has had a change in status. Patient was diagnosed with pneumonia several days ago and treatment was started. Family has decided to pull back on treatment and institute comfort care.  Past Medical History:  Diagnosis Date  . Alzheimer's disease 02/25/2013  . Alzheimer's disease, unspecified (CODE)   . Anxiety disorder, unspecified   . Cellulitis of unspecified part of limb   . Constipated   . Dementia   . Depression 02/25/2013  . Diverticulosis of intestine without perforation or abscess without bleeding   . DNR (do not resuscitate)   . Dysphagia 04/02/2013  . Dysphagia, oropharyngeal phase   . Edema 04/02/2013  . Hordeolum internum of right lower eyelid 02/03/2015  . Hyperglycemia 03/06/2014  . Hypertension   . Major depressive disorder, single episode, unspecified   . Osteoarthritis   . Osteoporosis 06/10/2014  . Overactive bladder   . Vitamin D deficiency 05/09/2016    Past Surgical History:  Procedure Laterality Date  . ADENOIDECTOMY    . bilateral cataract surgery    . FOOT SURGERY    . FRACTURE SURGERY    . JOINT REPLACEMENT    . KNEE SURGERY    . TONSILLECTOMY      Allergies as of 06/07/2017      Reactions   Hydrocodone    Prednisone       Medication List        Accurate as of 06/07/17 11:59 PM. Always use your most recent med list.          acetaminophen 325 MG tablet Commonly known as:  TYLENOL Take 2 tablets (650 mg) by mouth every 6 hours as needed for pain. Also take 2 tablets by mouth (650 mg) at bedtime. Do not exceed 4000 mg APAP in 24 hours.   calcium-vitamin D 500-200 MG-UNIT tablet Commonly known as:  OSCAL WITH D Take 1 tablet by mouth daily.   carboxymethylcellulose 1 % ophthalmic solution Place 1 drop into both eyes 2 (two) times daily.   CENTRUM SILVER PO Take 1 tablet by mouth daily.   divalproex 125 MG DR tablet Commonly known as:  DEPAKOTE Take 125 mg by mouth. Take one tablet daily. Take one tablet at bedtime   furosemide 20 MG tablet Commonly known as:  LASIX Take 30 mg by mouth daily. Give 1.5 tablets by mouth daily.   losartan 50 MG tablet Commonly known as:  COZAAR Take 50 mg by mouth daily.   rivastigmine 9.5 mg/24hr Commonly known as:  EXELON Place 1 patch (9.5 mg total) onto the skin daily.   senna-docusate 8.6-50 MG tablet Commonly known as:  Senokot-S Take 2 tablets by mouth daily.   UNABLE TO FIND Med Name: Ativan Gel  0.5 mg/ml : Apply 1 ml topically every evening at 4 pm daily for agitation   UNABLE TO FIND Med Name: Med Pass intiate 4 oz four times a day   UTI-STAT PO Take 30 mLs by mouth 2 (two) times daily.       No orders of the defined types were placed in this encounter.   Immunization History  Administered Date(s) Administered  . Influenza-Unspecified 07/06/2014, 06/18/2015  . Tdap 04/10/2012    Social History   Tobacco Use  . Smoking status: Never Smoker  . Smokeless tobacco: Never Used  Substance Use Topics  . Alcohol use: No    Review of Systems  unable to obtain secondary to dementia and condition    Vitals:   08/05/17 1413  BP: (!) 93/59  Pulse: 60  Temp: (!) 96.9 F (36.1 C)   Body mass index  is 20.55 kg/m. Physical Exam  GENERAL APPEARANCE: On normal, No acute distress  SKIN: No diaphoresis rash HEENT: Unremarkable RESPIRATORY: Breathing is even, unlabored. Lung sounds are rhonchi   CARDIOVASCULAR: Heart RRR no murmurs, rubs or gallops. No peripheral edema  GASTROINTESTINAL: Abdomen is soft, non-tender, not distended w/ normal bowel sounds.  GENITOURINARY: Bladder non tender, not distended  MUSCULOSKELETAL: No abnormal joints or musculature NEUROLOGIC: Cranial nerves 2-12 grossly intact. Moves all extremities PSYCHIATRIC: n/a  Patient Active Problem List   Diagnosis Date Noted  . History of recurrent UTIs 04/05/2017  . Mood disorder (HCC) 06/11/2016  . Bilateral lower extremity edema 06/11/2016  . Vitamin D deficiency 05/09/2016  . Anxiety 05/09/2016  . Infection due to ESBL-producing Escherichia coli 02/11/2016  . Elbow pain 01/27/2016  . Cerumen impaction 12/30/2015  . Contusion 12/20/2015  . UTI (urinary tract infection) 06/07/2015  . Hordeolum internum of right lower eyelid 02/03/2015  . Osteoporosis 06/10/2014  . Hyperglycemia 03/06/2014  . Dermatitis 03/06/2014  . Chronic venous insufficiency 12/08/2013  . Altered mental status 10/20/2013  . Complicated UTI (urinary tract infection) 05/01/2013  . Edema 04/02/2013  . Dysphagia 04/02/2013  . Depression 02/25/2013  . Constipation 02/25/2013  . Essential hypertension, benign 02/25/2013  . Alzheimer's disease 02/25/2013    CMP     Component Value Date/Time   NA 143 11/30/2016   K 5.1 11/30/2016   CL 94 (L) 06/02/2012 0950   CO2 31 06/02/2012 0950   GLUCOSE 93 06/02/2012 0950   BUN 18 11/30/2016   CREATININE 0.5 11/30/2016   CREATININE 0.60 06/02/2012 0950   CALCIUM 9.4 06/02/2012 0950   PROT 6.4 06/02/2012 0950   ALBUMIN 3.6 06/02/2012 0950   AST 15 11/30/2016   ALT 7 11/30/2016   ALKPHOS 50 11/30/2016   BILITOT 0.5 06/02/2012 0950   GFRNONAA 80 (L) 06/02/2012 0950   GFRAA >90 06/02/2012 0950    Recent Labs    11/30/16  NA 143  K 5.1  BUN 18  CREATININE 0.5   Recent Labs    11/30/16  AST 15  ALT 7  ALKPHOS 50   Recent Labs    11/18/16 11/30/16  WBC 6.6 6.9  HGB 13.5 14.3  HCT 40 43  PLT 193 197   Recent Labs    11/30/16  CHOL 181  LDLCALC 127  TRIG 65   Lab Results  Component Value Date   MICROALBUR 82 01/31/2016   MICROALBUR 4.7 01/31/2016   Lab Results  Component Value Date   TSH 1.31 11/30/2016   Lab Results  Component Value Date   HGBA1C 5.3 11/30/2016  Lab Results  Component Value Date   CHOL 181 11/30/2016   HDL 41 11/30/2016   LDLCALC 127 11/30/2016   TRIG 65 11/30/2016    Significant Diagnostic Results in last 30 days:  No results found.  Assessment and Plan  END OF LIFE ENCOUNTER- per family wishes antibiotics being stopped, oxygen for comfort care only, all other meds are being DC'd, drinking for comfort only if able; have written for morphine 20 mg/ML, 0.25 ML (5 mg) every 4 when necessary    Merrilee SeashoreAnne Alexander, MD
